# Patient Record
Sex: Male | Born: 1960 | Race: White | Hispanic: No | Marital: Married | Smoking: Current every day smoker
Health system: Southern US, Community
[De-identification: ages and names within clinical notes are randomized; demographics above are authoritative.]

## PROBLEM LIST (undated history)

## (undated) DIAGNOSIS — M1990 Unspecified osteoarthritis, unspecified site: Secondary | ICD-10-CM

---

## 2011-08-09 ENCOUNTER — Emergency Department: Payer: Self-pay | Admitting: Emergency Medicine

## 2012-07-01 ENCOUNTER — Emergency Department: Payer: Self-pay | Admitting: Emergency Medicine
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
## 2014-03-24 ENCOUNTER — Emergency Department: Payer: Self-pay | Admitting: Emergency Medicine

## 2014-07-14 ENCOUNTER — Emergency Department: Payer: Self-pay | Admitting: Internal Medicine

## 2014-10-13 ENCOUNTER — Encounter: Payer: Self-pay | Admitting: Emergency Medicine

## 2014-10-13 ENCOUNTER — Emergency Department
Admission: EM | Admit: 2014-10-13 | Discharge: 2014-10-13 | Disposition: A | Discharge status: Home / Self Care | Payer: Self-pay | Attending: Emergency Medicine | Admitting: Emergency Medicine

## 2014-10-13 DIAGNOSIS — M2550 Pain in unspecified joint: Secondary | ICD-10-CM

## 2014-10-13 DIAGNOSIS — Z720 Tobacco use: Secondary | ICD-10-CM | POA: Insufficient documentation

## 2014-10-13 DIAGNOSIS — M069 Rheumatoid arthritis, unspecified: Secondary | ICD-10-CM | POA: Insufficient documentation

## 2014-10-13 HISTORY — DX: Unspecified osteoarthritis, unspecified site: M19.90

## 2014-10-13 MED ORDER — OXYCODONE-ACETAMINOPHEN 5-325 MG PO TABS: 1.0000 | ORAL_TABLET | Freq: Four times a day (QID) | ORAL | Status: DC | PRN

## 2014-10-13 MED ORDER — PREDNISONE 20 MG PO TABS: 20.0000 mg | ORAL_TABLET | Freq: Every day | ORAL | Status: DC

## 2014-10-13 NOTE — ED Notes (Signed)
Pt with joint pain for a couple of years but worse recently. Pt with arthritis and states has appt at [facility] but cannot wait.

## 2014-10-13 NOTE — Discharge Instructions (Signed)
Please call [facility] at [phone] or use [facility] MyChart to send a message to your doctor if the prednisone does not seem to be helping the pain. Please seek medical attention for any high fevers, chest pain, shortness of breath, change in behavior, persistent vomiting, bloody stool or any other new or concerning symptoms.  Rheumatoid Arthritis Rheumatoid arthritis is a long-term (chronic) inflammatory disease that causes pain, swelling, and stiffness of the joints. It can affect the entire body, including the eyes and lungs. The effects of rheumatoid arthritis vary widely among those with the condition. CAUSES  The cause of rheumatoid arthritis is not known. It tends to run in families and is more common in women. Certain cells of the body's natural defense system (immune system) do not work properly and begin to attack healthy joints. It primarily involves the connective tissue that lines the joints (synovial membrane). This can cause damage to the joint. SYMPTOMS   Pain, stiffness, swelling, and decreased motion of many joints, especially in the hands and feet.  Stiffness that is worse in the morning. It may last 1-2 hours or longer.  Numbness and tingling in the hands.  Fatigue.  Loss of appetite.  Weight loss.  Low-grade fever.  Dry eyes and mouth.  Firm lumps (rheumatoid nodules) that grow beneath the skin in areas such as the elbows and hands. DIAGNOSIS  Diagnosis is based on the symptoms described, an exam, and blood tests. Sometimes, X-rays are helpful. TREATMENT  The goals of treatment are to relieve pain, reduce inflammation, and to slow down or stop joint damage and disability. Methods vary and may include:  Maintaining a balance of rest, exercise, and proper nutrition.  Medicines:  Pain relievers (analgesics).  Corticosteroids and nonsteroidal anti-inflammatory drugs (NSAIDs) to reduce inflammation.  Disease-modifying antirheumatic drugs (DMARDs) to try to  slow the course of the disease.  Biologic response modifiers to reduce inflammation and damage.  Physical therapy and occupational therapy.  Surgery for patients with severe joint damage. Joint replacement or fusing of joints may be needed.  Routine monitoring and ongoing care, such as office visits, blood and urine tests, and X-rays. HOME CARE INSTRUCTIONS   Remain physically active and reduce activity when the disease gets worse.  Eat a well-balanced diet.  Put heat on affected joints when you wake up and before activities. Keep the heat on the affected joint for as long as directed by your health care provider.  Put ice on affected joints following activities or exercising.  Put ice in a plastic bag.  Place a towel between your skin and the bag.  Leave the ice on for 15-20 minutes, 3-4 times per day, or as directed by your health care provider.  Take medicines and supplements only as directed by your health care provider.  Use splints as directed by your health care provider. Splints help maintain joint position and function.  Do not sleep with pillows under your knees. This may lead to spasms.  Participate in a self-management program to keep current with the latest treatment and coping skills. SEEK IMMEDIATE MEDICAL CARE IF:  You have fainting episodes.  You have periods of extreme weakness.  You rapidly develop a hot, painful joint that is more severe than usual joint aches.  You have chills.  You have a fever. FOR MORE INFORMATION   American College [employer] Rheumatology: www.rheumatology.org  Arthritis Foundation: www.arthritis.org Document Released: 04/14/2000 Document Revised: 09/01/2013 Document Reviewed: 05/24/2011 [facility] Patient Information 2015 [place], [place]. This information  is not intended to replace advice given to you by your health care provider. Make sure you discuss any questions you have with your health care provider.

## 2014-10-13 NOTE — ED Provider Notes (Signed)
[facility] Emergency Department Provider Note   ____________________________________________  Time seen: 1405  I have reviewed the triage vital signs and the nursing notes.   HISTORY  Chief Complaint Joint Pain   History limited by: Not Limited   HPI Francisco Woods is a 54 y.o. male who presents to the emergency department with bilateral knee, hip, shoulder and neck pain. Patient states that the symptoms have been severe for the past couple of days. He has had similar pain for a number of years and is being followed at [facility] rheumatology. He states he has been told he has rheumatoid arthritis. Had been on 60 mg of prednisone however his prescription ended about 2 weeks ago. He states that since then the pain has gradually been coming back. He denies any fevers, nausea or vomiting, chest pain or shortness of breath.     Past Medical History  Diagnosis Date  . Arthritis     There are no active problems to display for this patient.   History reviewed. No pertinent past surgical history.  No current outpatient prescriptions on file.  Allergies Review of patient's allergies indicates no known allergies.  No family history on file.  Social History History  Substance Use Topics  . Smoking status: Current Every Day Smoker -- 1.00 packs/day    Types: Cigarettes  . Smokeless tobacco: Not on file  . Alcohol Use: Yes     Comment: occassionally     Review of Systems  Constitutional: Negative for fever. Cardiovascular: Negative for chest pain. Respiratory: Negative for shortness of breath. Gastrointestinal: Negative for abdominal pain, vomiting and diarrhea. Genitourinary: Negative for dysuria. Musculoskeletal: Positive for back pain, knee pain, hip pain, shoulder pain, neck pain Skin: Negative for rash. Neurological: Negative for headaches, focal weakness or numbness.   10-point ROS otherwise  negative.  ____________________________________________   PHYSICAL EXAM:  VITAL SIGNS: ED Triage Vitals  Enc Vitals Group     BP 10/13/14 1208 124/73 mmHg     Pulse Rate 10/13/14 1208 74     Resp 10/13/14 1208 18     Temp 10/13/14 1208 98.5 F (36.9 C)     Temp Source 10/13/14 1208 Oral     SpO2 10/13/14 1208 99 %     Weight 10/13/14 1208 154 lb (69.854 kg)     Height 10/13/14 1208 6' (1.829 m)     Head Cir --      Peak Flow --      Pain Score 10/13/14 1208 10   Constitutional: Alert and oriented. Well appearing and in no distress. Eyes: Conjunctivae are normal. PERRL. Normal extraocular movements. ENT   Head: Normocephalic and atraumatic.   Nose: No congestion/rhinnorhea.   Mouth/Throat: Mucous membranes are moist.   Neck: No stridor. Hematological/Lymphatic/Immunilogical: No cervical lymphadenopathy. Cardiovascular: Normal rate, regular rhythm.  No murmurs, rubs, or gallops. Respiratory: Normal respiratory effort without tachypnea nor retractions. Breath sounds are clear and equal bilaterally. No wheezes/rales/rhonchi. Gastrointestinal: Soft and nontender. No distention.  Genitourinary: Deferred Musculoskeletal: Slightly painful range of motion of knees, shoulders. Neurologic:  Normal speech and language. No gross focal neurologic deficits are appreciated. Speech is normal.  Skin:  Skin is warm, dry and intact. No rash noted. Psychiatric: Mood and affect are normal. Speech and behavior are normal. Patient exhibits appropriate insight and judgment.  ____________________________________________    LABS (pertinent positives/negatives)  None  ____________________________________________   EKG  None  ____________________________________________    RADIOLOGY  None  ____________________________________________  PROCEDURES  Procedure(s) performed: None  Critical Care performed: No  ____________________________________________   INITIAL  IMPRESSION / ASSESSMENT AND PLAN / ED COURSE  Pertinent labs & imaging results that were available during my care of the patient were reviewed by me and considered in my medical decision making (see chart for details).  Patient here with joint pain and history of rheumatoid arthritis. States that he ran out of his prednisone roughly 2 weeks ago and has had increasing pain since then. I talked to [facility] rheumatology who recommended placing the patient on 20 mg of prednisone. I will also give short course of pain medication. Patient has follow-up appointment scheduled on the 27th.  ____________________________________________   FINAL CLINICAL IMPRESSION(S) / ED DIAGNOSES  Final diagnoses:  Joint pain  Rheumatoid arthritis     Phineas Semen, MD 10/13/14 1450

## 2014-10-13 NOTE — ED Notes (Addendum)
Pt reports pain to bilateral knees, right hip, left shoulder and neck.

## 2014-11-23 ENCOUNTER — Ambulatory Visit: Payer: Self-pay | Admitting: Family Medicine

## 2015-02-17 ENCOUNTER — Emergency Department
Admission: EM | Admit: 2015-02-17 | Discharge: 2015-02-17 | Disposition: A | Discharge status: Home / Self Care | Payer: Self-pay | Attending: Emergency Medicine | Admitting: Emergency Medicine

## 2015-02-17 DIAGNOSIS — M069 Rheumatoid arthritis, unspecified: Secondary | ICD-10-CM | POA: Insufficient documentation

## 2015-02-17 DIAGNOSIS — G8929 Other chronic pain: Secondary | ICD-10-CM | POA: Insufficient documentation

## 2015-02-17 DIAGNOSIS — Z720 Tobacco use: Secondary | ICD-10-CM | POA: Insufficient documentation

## 2015-02-17 DIAGNOSIS — M549 Dorsalgia, unspecified: Secondary | ICD-10-CM | POA: Insufficient documentation

## 2015-02-17 MED ORDER — TRAMADOL HCL 50 MG PO TABS: 50.0000 mg | ORAL_TABLET | Freq: Four times a day (QID) | ORAL | Status: DC | PRN

## 2015-02-17 MED ORDER — PREDNISONE 20 MG PO TABS: 20.0000 mg | ORAL_TABLET | Freq: Every day | ORAL | Status: AC

## 2015-02-17 NOTE — ED Notes (Signed)
Here for med refill, cant' Rx meds from [facility]. Hx RA

## 2015-02-17 NOTE — ED Provider Notes (Signed)
[facility] Emergency Department Provider Note  ____________________________________________  Time seen: Approximately 10:57 AM  I have reviewed the triage vital signs and the nursing notes.   HISTORY  Chief Complaint Joint Pain  HPI Francisco Woods is a 54 y.o. male is here with complaint of bilateral knee pain, right ankle pain and left shoulder pain due to his rheumatoid arthritis. He states that he sees someone at [facility] and has not seen them for several months. He does not take any rheumatoid medications as "he cannot afford them" and he is waiting for "charity care" at [facility]. He states he has never taken any medication for his rheumatoid arthritis. Currently he complains that his pain being 10 out of 10 and increased pain with walking. Pain is constant and nonradiating. He denies any previous peptic ulcer disease or difficulty taking medication.   Past Medical History  Diagnosis Date  . Arthritis     There are no active problems to display for this patient.   History reviewed. No pertinent past surgical history.  Current Outpatient Rx  Name  Route  Sig  Dispense  Refill  . oxyCODONE-acetaminophen (ROXICET) 5-325 MG per tablet   Oral   Take 1 tablet by mouth every 6 (six) hours as needed for severe pain.   10 tablet   0   . predniSONE (DELTASONE) 20 MG tablet   Oral   Take 1 tablet (20 mg total) by mouth daily.   30 tablet   0   . traMADol (ULTRAM) 50 MG tablet   Oral   Take 1 tablet (50 mg total) by mouth every 6 (six) hours as needed.   20 tablet   0     Allergies Review of patient's allergies indicates no known allergies.  History reviewed. No pertinent family history.  Social History Social History  Substance Use Topics  . Smoking status: Current Every Day Smoker -- 1.00 packs/day    Types: Cigarettes  . Smokeless tobacco: None  . Alcohol Use: Yes     Comment: occassionally     Review of Systems Constitutional: No  fever/chills Eyes: No visual changes. ENT: No sore throat. Cardiovascular: Denies chest pain. Respiratory: Denies shortness of breath. Gastrointestinal: No abdominal pain.  No nausea, no vomiting.  Genitourinary: Negative for dysuria. Musculoskeletal: Positive for chronic back pain Skin: Negative for rash. Neurological: Negative for headaches, focal weakness or numbness.  10-point ROS otherwise negative.  ____________________________________________   PHYSICAL EXAM:  VITAL SIGNS: ED Triage Vitals  Enc Vitals Group     BP 02/17/15 1021 125/103 mmHg     Pulse Rate 02/17/15 1021 82     Resp 02/17/15 1021 18     Temp 02/17/15 1021 97.9 F (36.6 C)     Temp Source 02/17/15 1021 Oral     SpO2 02/17/15 1021 100 %     Weight 02/17/15 1021 145 lb (65.772 kg)     Height 02/17/15 1021 6' (1.829 m)     Head Cir --      Peak Flow --      Pain Score 02/17/15 1022 10     Pain Loc --      Pain Edu? --      Excl. in GC? --     Constitutional: Alert and oriented. Well appearing and in no acute distress. Eyes: Conjunctivae are normal. PERRL. EOMI. Head: Atraumatic. Nose: No congestion/rhinnorhea.  Neck: No stridor.  Supple Hematological/Lymphatic/Immunilogical: No cervical lymphadenopathy. Cardiovascular: Normal rate, regular rhythm.  Grossly normal heart sounds.  Good peripheral circulation. Respiratory: Normal respiratory effort.  No retractions. Lungs CTAB. Gastrointestinal: Soft and nontender. No distention. Musculoskeletal: Moderate tenderness on palpation of the right knee with degenerative changes. Range of motion is restricted secondary to pain. No effusion is noted in the right knee. There is moderate crepitus. Right ankle with arthritic changes and no effusion or soft tissue edema noted. Left knee tender with less degenerative change than the right. No effusion noted. Right shoulder range of motion slightly restricted. There is generalized tenderness on palpation of the right  shoulder. Neurologic:  Normal speech and language. No gross focal neurologic deficits are appreciated. No gait instability. Skin:  Skin is warm, dry and intact. No rash noted. There is no warmth, erythema, or ecchymosis noted in the above-mentioned joints. Psychiatric: Mood and affect are normal. Speech and behavior are normal.  ____________________________________________   LABS (all labs ordered are listed, but only abnormal results are displayed)  Labs Reviewed - No data to display   RADIOLOGY  Patient refused ____________________________________________   PROCEDURES  Procedure(s) performed: None  Critical Care performed: No  ____________________________________________   INITIAL IMPRESSION / ASSESSMENT AND PLAN / ED COURSE  Pertinent labs & imaging results that were available during my care of the patient were reviewed by me and considered in my medical decision making (see chart for details).  Patient was encouraged to follow up with his rheumatologist and be compliant with his medication. He is also to check on his "charity care" for medication. Today he was given a prescription for tramadol one every 6 hours as needed for pain. He is also given a prescription for prednisone 20 mg daily for 30 days. He is also given information about rheumatoid arthritis. Patient refused knee x-ray in the emergency room today. ____________________________________________   FINAL CLINICAL IMPRESSION(S) / ED DIAGNOSES  Final diagnoses:  Rheumatoid arthritis involving multiple joints (HCC)      Tommi Rumps, PA-C 02/17/15 1322  Governor Rooks, MD 02/17/15 [phone]

## 2015-02-17 NOTE — ED Notes (Signed)
Patient comes in with complaints of bilateral knee pain, right ankle and left shoulder pain secondary to rheumatoid arthritis.

## 2015-02-17 NOTE — Discharge Instructions (Signed)
He was given an appointment with your doctor at [facility] for your rheumatoid arthritis. Take prednisone as prescribed daily. Tramadol as needed for pain. Rheumatoid arthritis actually destroys the bone and will continue to do so. It is important that you follow-up with your rheumatologist on a regular basis.

## 2015-06-27 NOTE — ED Provider Notes (Signed)
 [facility] Emergency Department Provider Note   ED Course, Assessment and Plan   Initial Clinical Impression:  June 27, 2015 6:48 PM  Francisco Woods is a 55 y.o. y/o male with possible rheumatoid flare. Will treat with pain medications, nausea medications, obtain inflammatory markers, basic labs, x-ray of his shoulder.  If these are negative, anticipate discharge. Handing off to Dr. Oletha Sierras.   The case was discussed with attending physician who is in agreement with the above assessment and plan    History   CHIEF COMPLAINT:  Chief Complaint  Patient presents with  . Shoulder Pain  . Emesis    HPI: Francisco Woods is a 55 y.o. male who presents with onset of throbbing right neck right shoulder right elbow and right wrist pain over the past 4-5 hours. 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 During the course of his tenderness activity began to develop this pain down the right side of his arm and his neck the pain is also present in between the joints. There is worsened with motion or palpation of the arm. It is worse in the joints. He states his a history of rheumatoid arthritis is only in the past for pain medication no biologics. He is refusing to answer complicated questions or cooperate with exam maneuvers, stating he needs some pain medications first. He states he vomited on the way here.  Patient later states that he takes Suboxone twice a day, due to a history of pill abuse. He states he uses no other pain medications apart from ibuprofen.   PAST MEDICAL HISTORY/PAST SURGICAL HISTORY:  Past Medical History  Diagnosis Date  . Back pain   . Rheumatoid arthritis involving multiple sites with positive rheumatoid factor (RAF-HCC) 09/11/2014  . Rheumatoid arthritis (RAF-HCC)     MEDICATIONS:  Current Outpatient Rx  Name  Route  Sig  Dispense  Refill  . colchicine 0.6 mg tablet    Oral   Take 1 tablet (0.6 mg total) by mouth Two (2) times a day.   60 tablet   4   . folic acid (FOLVITE) 1 MG tablet   Oral   Take 1 tablet (1 mg total) by mouth daily.   100 tablet   3   . meloxicam (MOBIC) 15 MG tablet   Oral   Take 1 tablet (15 mg total) by mouth daily.   30 tablet   5   . methotrexate 2.5 MG tablet      Take 4 tablets weekly for 2 weeks, then 6 tablets weekly for 2 weeks, then 8 tablets weekly thereafter.   40 tablet   3     Patient will titrate dose up as directed.  Future  ...   . oxyCODONE -acetaminophen  (PERCOCET) 5-325 mg per tablet   Oral   Take 1 tablet by mouth every four (4) hours as needed for pain.   20 tablet   0     ALLERGIES:  Review of patient's allergies indicates no known allergies.  SOCIAL HISTORY:  Social History  Substance Use Topics  . Smoking status: Current Every Day Smoker -- 1.00 packs/day    Types: Cigarettes  . Smokeless tobacco: Never Used  . Alcohol Use: No     Comment: cannot drink on new meds    FAMILY HISTORY: Family History  Problem Relation Age of Onset  . Autoimmune disease Neg Hx  Review of Systems  Constitutional: Negative for weight loss Respiratory: Negative for hemoptysis A 10 point review of systems was performed and is negative other than positive elements noted in HPI    Physical Exam   VITAL SIGNS:   BP 121/76 mmHg  Pulse 68  Temp(Src) 36.7 C (98.1 F) (Oral)  Resp 16  SpO2 97%  General: no apparent distress  HENT: no stridor, MMM, atraumatic Eyes: conjunctiva normal, no discharge Cardiovascular: S1,S2 heard, 2+ symmetric distal pulses Respiratory: no respiratory distress, normal chest excursion, no wheezing  Abdomen: soft, non-distended  Skin: no diaphoresis, normal color Extremities: Neurovascularly intact in his hand. He moves his elbow well, is hesitant and refusing to allow me to try to move his shoulder. His joints are not warm though they're diffusely tender to  palpation. His arms are tender to palpation in between his joints as well. Neurologic: CN II-XII grossly intact, no slurred speech or dysarthria, normal sensation and motor function, moves all 4 extremities equally Psychiatric: Mood appropriate   Radiology   XR Shoulder 3 Or More Views Right  Final Result     Xr Shoulder 3 Or More Views Right  06/27/2015  EXAM: SHOULDER COMPLETE RIGHT DATE: 06/26/15 20:46:24 ACCESSION: [id number] UN DICTATED: 06/26/15 21:21:01 INTERPRETATION LOCATION: [facility] CLINICAL INDICATION: 55 Year Old (M): Right shoulder pain COMPARISON: None. TECHNIQUE: AP, Grashey, and axillary views of the right shoulder. FINDINGS: No acute fracture or dislocation. Calcium density about the superior proximal humerus is likely from chronic rotator cuff disease. Joint spaces are otherwise preserved and in normal alignment. Partially imaged right lung clear. No radiopaque foreign body. IMPRESSION: Chronic rotator cuff disease without acute radiographic injury of the right shoulder.     Labs    Pertinent labs & imaging results that were available during my care of the patient were reviewed by me and considered in my medical decision making (see chart for details).   Please note- This chart has been created using [employer]. Chart creation errors have been sought, but may not always be located and such creation errors, especially pronoun confusion, do NOT reflect on the standard of medical care.   Fairy JINNY Glaze, MD Resident 06/27/15 [phone]

## 2015-10-07 IMAGING — CR LEFT THUMB 2+V
1 series · 3 of 3 positions shown · non-contrast
Comparison: None.

CLINICAL DATA: Left thumb injury with solid

EXAM:
LEFT THUMB 2+V

[Series 1: pa · 0.17mm/px · 3 of 3 slices shown]
[im 1/3]
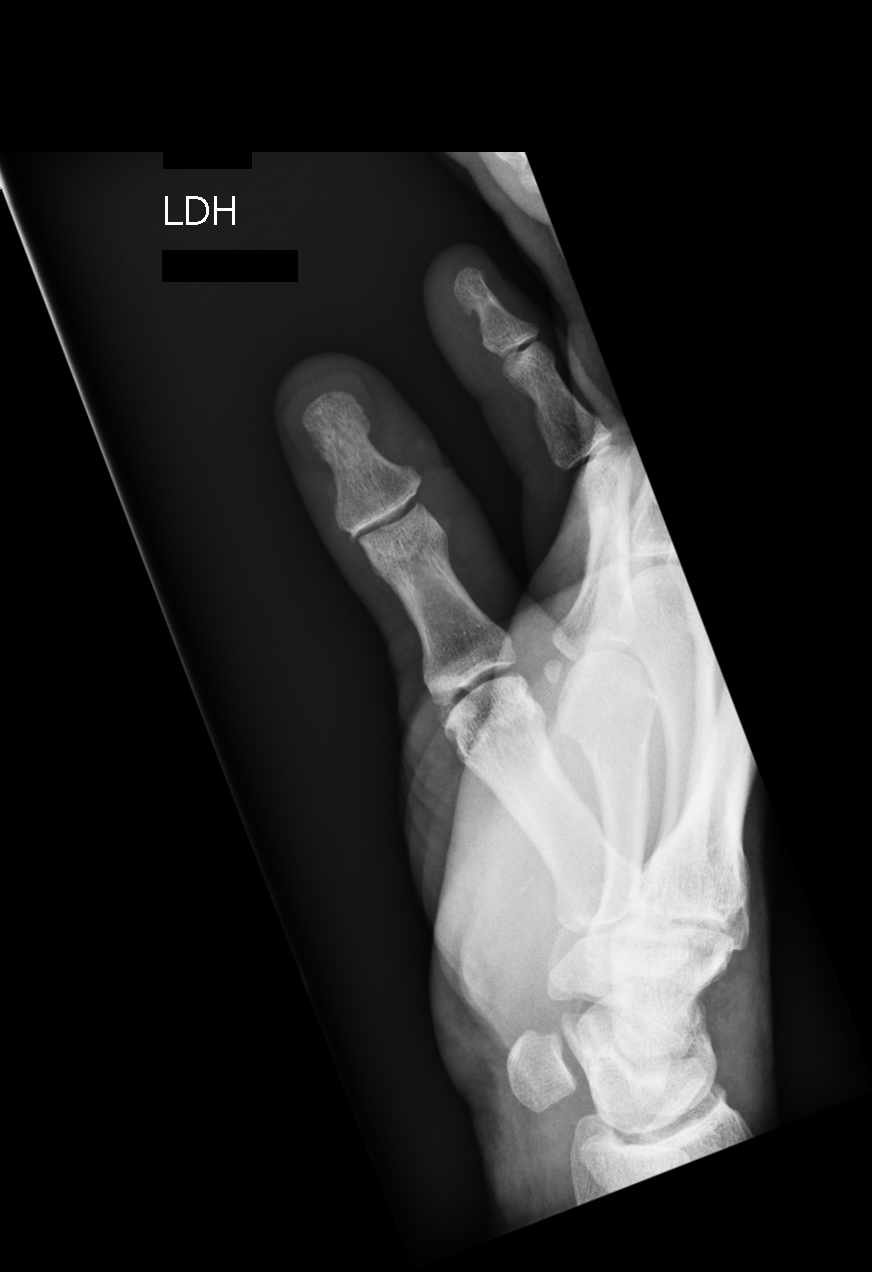
[im 2/3]
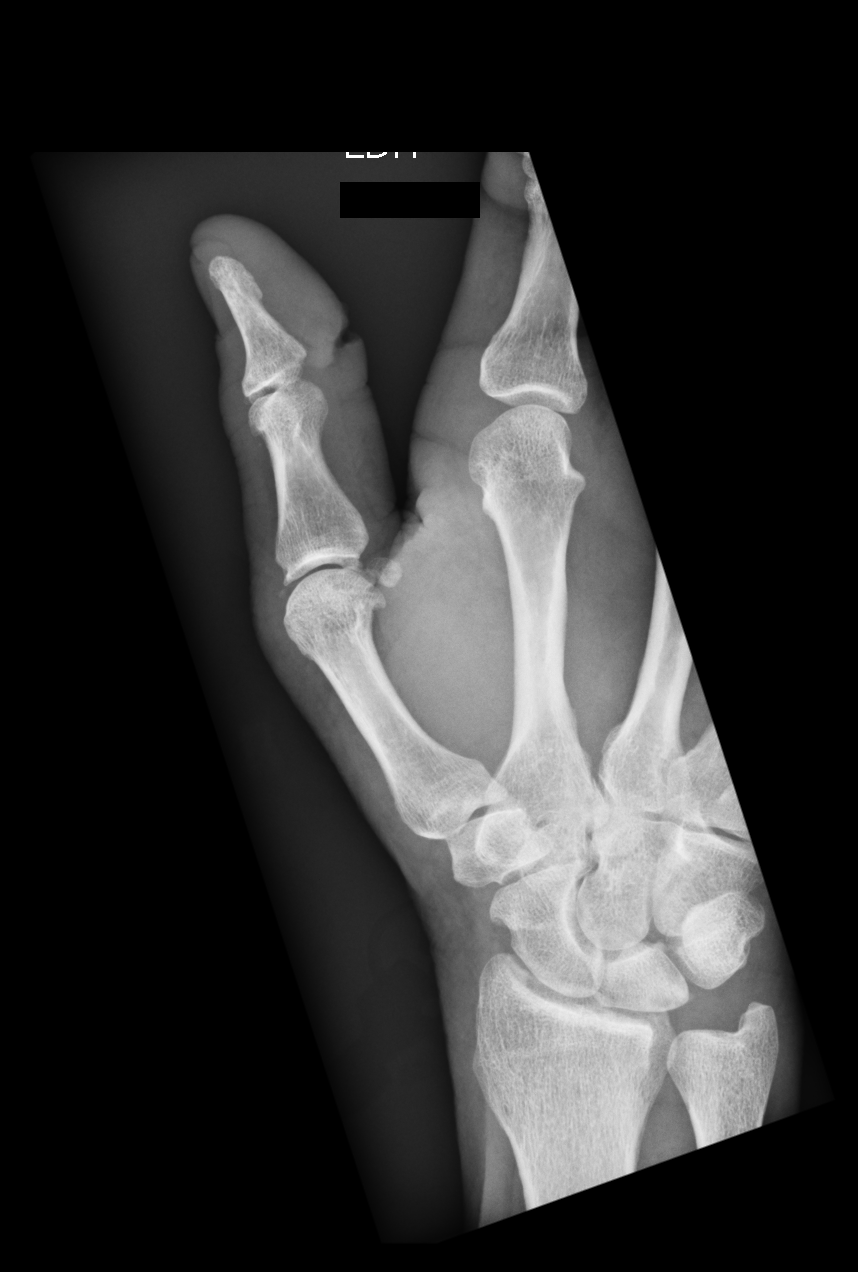
[im 3/3]
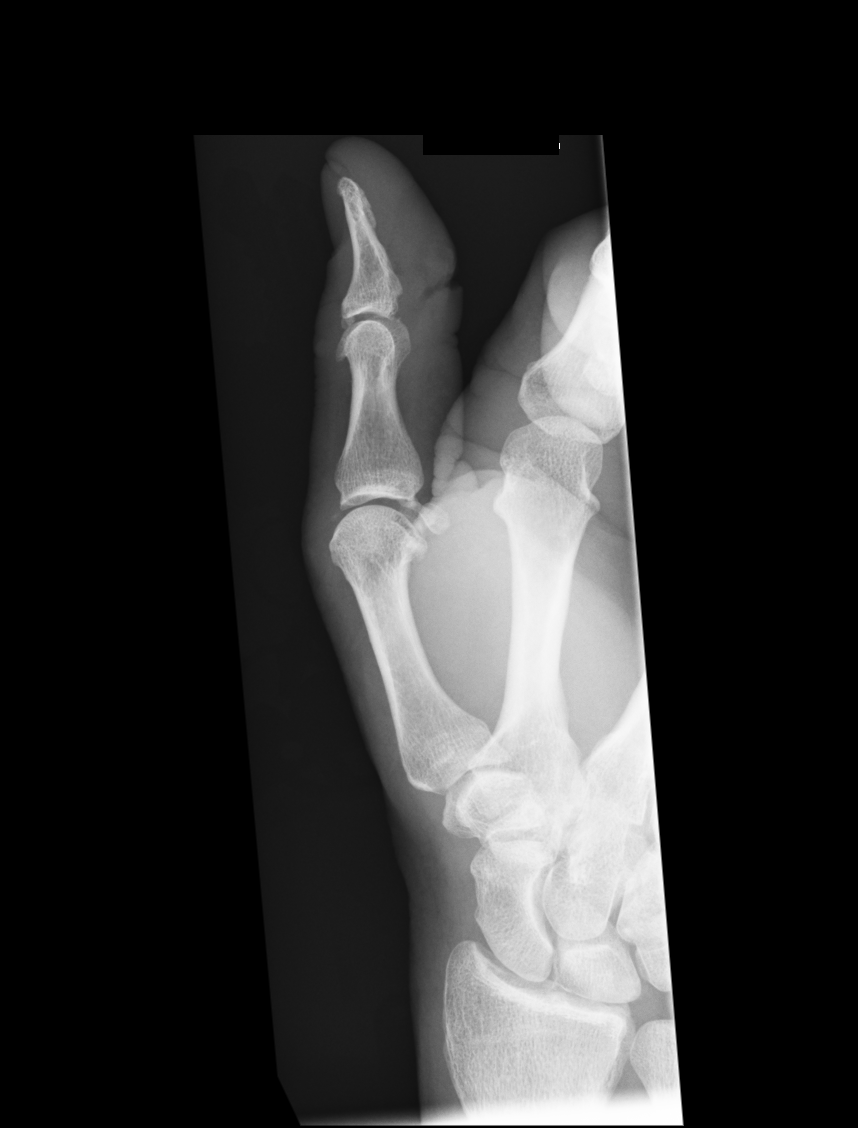

[3 of 3 positions shown; findings below may reference images not displayed]

FINDINGS: There is a soft tissue injury at the tuft of the thumb. No
underlying fracture or dislocation.
IMPRESSION: No acute bony injury.

## 2016-01-10 NOTE — Progress Notes (Signed)
 [facility]  Adult Sick Visit   Assessment & Plan:  Francisco Woods is a 55 y.o. male who presented in follow up of his RA, arthritis pain, abdominal pain, and new urinary complaints.  Early satiety, unintentional weight loss, abdominal pain: Patient has increased risk of GI malignancy considering his smoking and EtOH history. Discussed this with him today. I think that his ongoing weight loss is concerning enough to warrant EGD and colonoscopy. He has not had a colonoscopy for screening after age 76, so he is due for this anyway. Previously with an anemia, but this resolved as of last visit. Considering the amount of ibuprofen and BC powder he is using, would also consider PUD as a cause for his abdominal pain. He has new complaints of urinary stream hesitancy/interruption, so prostate etiology is a consideration as well. Will check a PSA. CBC was normal 1 mo ago. - EGD - colonoscopy - stop smoking- patient is contemplative. States that his mother has patches at home that she is not going to use. Recommended that he start cutting back as smoking is likely exacerbating his GERD and possibly contributing to his weight loss. - cut back to only 2 EtOH drinks per day - restart omeprazole. Will add ranitidine in addition as previously with no benefit on solely omeprazole.  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 Patient is supposed to be on MTX therapy, but self discontinued this medication after ~3 months secondary to no effect. Discussed with him that it is inappropriate to discontinue these medications without talking with rheumatology. If he wants to decrease the amount of inflammation in his joints, he needs to keep his appointments and attempt to take the medications as directed. He will  likely not be eligible for alternative regimens until he is documented to have failed MTX officially, which requires him to take the medication and follow up with his rheumatologist. He states that he understands this and will make efforts to make his next scheduled appt with them. As far as his pain control goes, he can actually take more ibuprofen than he is taking. Asked him to increase to 600-800mg  ibuprofen QID. He needs to stop taking BC powders, which are more likely to cause abdominal pain. As above, will treat abdominal pain with H2 blocker therapy in an attempt to make taking ibuprofen less difficult for his stomach. Will also start lyrica 50 mg BID today. We can increase this over time if he finds effect in it. Discussed with him that this medication will take time to kick in and is not as immediate as ibuprofen/BC powders. - start lyrica 50 mg BID, will follow up in 1 mo and increase if needed - increase ibuprofen to 600-800mg  QID prn, H2 blocker therapy as above - return to rheumatology - ESR check today. CRP elevated at last visit.  Urinary Hesitancy, Interrupted stream: denies hematuria. Cr slightly increased from prior one month ago, but wnl. Declined prostate exam today. Given his unintentional weight loss, this is somewhat concerning. Would consider prostate cancer. With cigarette use and occupational chemical use, he is likely also at increased risk of renal and bladder cancer.  - Will check PSA, as above.  - Repeat BMP today to ensure that he is not having sustained increase in Cr.  - Will check UA as well.  Follow-up:  No future appointments.  This patient was discussed with attending physician, Dr. Gwenn, who agrees with the assessment and plan above.   Subjective:  Francisco Woods is a 55 y.o. male who presents in follow up  States that he is having a lot of pain. He is having pain in almost all of his joints. Hands, hips, back are bothering him the most. Has about  35-40 minutes of morning stiffness. He is having Raynaud's in relation to the weather changes which has been bothersome. Currently not taking any medication for his pain other than BC powder and ibuprofen. Taking about 6-8 ibuprofen a day. BC powders he is taking 2-3 times per day. Has tried hot and cold packs without significant improvement when he stops them. Was previously on MTX without significant relief in the past, so he stopped taking it.  He states that he is also having stomach pain. States that he has symptoms of acid reflux, early satiety, incomplete swallowing. He has unintentional weight loss of 5 lbs in the past 4 months. Denies blood/melena in BM. No change in stool frequency. He is a 1 ppd smoker and lives with mom who also smokes. Drinks 4-5 beers at a time 3-4 times per week. He stopped taking prilosec. Never throwing up blood. CBC at last check was normal (improved from 5 months previously).  Sleeping medication was not helping him. He took it until the prescription ran out and then stopped. He is waking up from the pain, which makes things more difficult.  Also with slow urination, intermittent stream. Waking up several times a night to urinate. Cr at last check was mildly elevated from 6 months previously.  ROS: pertinent items are noted in HPI.  Past Medical History: Past Medical History:  Diagnosis Date  . Back pain   . Rheumatoid arthritis (RAF-HCC)   . Rheumatoid arthritis involving multiple sites with positive rheumatoid factor (RAF-HCC) 09/11/2014    Medications: Current Outpatient Prescriptions  Medication Sig Dispense Refill  . ibuprofen (ADVIL,MOTRIN) 200 MG tablet Take 200 mg by mouth once as needed for pain.    SABRA omeprazole (PRILOSEC) 40 MG capsule Take 1 capsule (40 mg total) by mouth daily. 30 capsule 1  . traZODone (DESYREL) 50 MG tablet Take 1 tablet (50 mg total) by mouth nightly. 30 tablet 3   No current facility-administered medications for this visit.      Allergies: No Known Allergies   Objective:   Vitals:   01/10/16 1516  BP: 143/59  BP Site: L Arm  Pulse: 77  Weight: 65.3 kg  Height: 185.4 cm (6' 0.99)   Wt Readings from Last 3 Encounters:  01/10/16 65.3 kg  11/22/15 65.7 kg  09/13/15 67.9 kg   Body mass index is 19 kg/m.   Physical Exam:  Gen: NAD, well-appearing, thin, pleasant, smells heavily of cigarettes HEENT: PERRL, EOMI, No Icterus. Oropharynx without exudate or erythema. Poor dentition. Mucous membranes moist.  No adenopathy. CVS: RRR, normal S1 and S2, No MRG. PULM: CTAB, no increased WOB. ABD: +BS, Soft, tender throughout, negative rebound EXTR: Cap refill <2 sec. Warm, dry. Enlarged MCP and PIP joints of hands bilaterally, particularly the thumb and forefinger MCPs. No erythema or warmth over joints. Mild synovitis of the thumb and forefinger. ROM throughout. SKIN: no rash noted. No Raynaud's present currently

## 2022-06-15 NOTE — ED Triage Notes (Signed)
 Patient reports pain, swelling in left testicle that he noticed four days ago. Patient endorses difficulty starting a stream, emptying bladder but denies frequency, urgency and burning .

## 2022-06-15 NOTE — ED Provider Notes (Signed)
 Emergency Department Provider Note   Medical Decision Making    Jabir Dahlem Carrera is a pleasant 62 y.o. male w/ a history of tobacco use, rheumatoid arthritis, back pain who presents with 4 days of left testicular pain and swelling associated with dysuria and hesitant urinary stream.  On presentation, vitals were hypertensive to the 200/100s but otherwise WNL on room air. Physical exam revealed chronically ill-appearing adult male who appears older than stated age but otherwise nontoxic with a GU exam that showed a diffusely swollen and tender left testicle with no penile or scrotal lesions, evidence of inguinal hernia.  Unable to assess for cremaster reflex given testicles fully retracted to base of penis.   Initial differential included epididymitis, orchitis, testicular abscess, torsion.  Inguinal hernia with extension into the scrotum seems less likely given his exam but is also a possibility.  Urinalysis moderate leukocyte esterase and positive nitrites with 10 WBCs.  Testicular/scrotal ultrasound showed an enlarged left epididymitis with fluid collection consistent with hydrocele.  Gonorrhea/chlamydia sent.  Ultimate impression was most likely acute infectious epididymitis of the left testes.  Given that he is sexually active with multiple partners I began management with ceftriaxone and levofloxacin.  I sent referral for urology follow-up to ensure that this is not secondary to an epididymal lesion.  Return precautions reviewed.  Historians interviewed Patient  Studies independently reviewed  None  Consultants involved  None  Prior records reviewed  None  Testing considered None  Social factors affecting disposition  Ongoing tobacco and alcohol use  Disposition  Safe for discharge home   History   HPI: Waco Foerster is a pleasant 62 y.o. male w/ a history of tobacco use, rheumatoid arthritis, back pain who presents with left testicular pain and swelling.  He reports that he  was in his usual state of health until about 4 days ago when he began to get dysuria and pain and swelling of his left testicle.  He says that the pain radiates to his left groin.  He notes that it is somewhat difficult to initiate a stream during this time.  He denies fever, chills, cough, shortness of breath, nausea, vomiting, diarrhea, hematuria, urinary frequency, penile lesions, scrotal lesions, erythema in the groin and genitals, and itching of the groin and genitals.  He says that he is sexually active with women only.  Says he has had 2 partners in the last year.  He denies ever having an STI in the past.  Says that he has never had urologic surgery before.  Physical Exam   Vitals:  Vitals:   06/15/22 0911  BP: (S) 206/106  Pulse: 90  Resp: 20  Temp: 36.4 C (97.6 F)  TempSrc: Temporal  SpO2: 98%     Physical Exam:  General: Chronically ill-appearing adult male who appears older than stated age.  No apparent distress.  Nontoxic. Head: Atraumatic and normocephalic.   Eyes: Conjugate gaze. No scleral icterus or conjunctivitis.   ENT: Moist mucous membranes. No stridor. Controlling secretions well.   Cardiac: RRR.  No MRG. Pulmonary: Breathing comfortably w/ no accessory muscle usage. Normal inspiratory:expiratory ratio. Clear to auscultation bilaterally.   Abdominal: Soft, non-distended, non-tender abdomen.   GU: Performed with male chaperone present.  The penis appears normal.  There are no inguinal masses.  There is a swollen and diffusely tender left testicle.  There are no lesions on the penis or scrotum.  Unable to assess for cremasteric reflex given that both testicles are already fully  retracted to the base of the penis. MSK: No lower extremity edema.   Dermatologic: No jaundice, cyanosis, pallor, or rashes visible on exposed skin.   Neurologic: Alert. Follows commands. Moving all extremities spontaneously.   Psychiatric: Normal mood and behavior.      [place]ia [place], MD   Clinical Assistant Professor [facility] Emergency Department           [place] Camellia Mussel, MD 06/15/22 [phone]

## 2024-02-15 ENCOUNTER — Emergency Department

## 2024-02-15 ENCOUNTER — Inpatient Hospital Stay
Admission: EM | Admit: 2024-02-15 | Discharge: 2024-02-24 | DRG: 682 | Disposition: A | Discharge status: Home / Self Care | Attending: Student | Admitting: Student

## 2024-02-15 ENCOUNTER — Other Ambulatory Visit: Payer: Self-pay

## 2024-02-15 DIAGNOSIS — N401 Enlarged prostate with lower urinary tract symptoms: Secondary | ICD-10-CM | POA: Diagnosis present

## 2024-02-15 DIAGNOSIS — Z8249 Family history of ischemic heart disease and other diseases of the circulatory system: Secondary | ICD-10-CM

## 2024-02-15 DIAGNOSIS — I160 Hypertensive urgency: Secondary | ICD-10-CM | POA: Diagnosis present

## 2024-02-15 DIAGNOSIS — E871 Hypo-osmolality and hyponatremia: Secondary | ICD-10-CM | POA: Diagnosis present

## 2024-02-15 DIAGNOSIS — R339 Retention of urine, unspecified: Principal | ICD-10-CM

## 2024-02-15 DIAGNOSIS — E43 Unspecified severe protein-calorie malnutrition: Secondary | ICD-10-CM | POA: Diagnosis present

## 2024-02-15 DIAGNOSIS — N179 Acute kidney failure, unspecified: Principal | ICD-10-CM | POA: Diagnosis present

## 2024-02-15 DIAGNOSIS — F17210 Nicotine dependence, cigarettes, uncomplicated: Secondary | ICD-10-CM | POA: Diagnosis present

## 2024-02-15 DIAGNOSIS — Z681 Body mass index (BMI) 19 or less, adult: Secondary | ICD-10-CM

## 2024-02-15 DIAGNOSIS — I10 Essential (primary) hypertension: Secondary | ICD-10-CM | POA: Diagnosis present

## 2024-02-15 DIAGNOSIS — E872 Acidosis, unspecified: Secondary | ICD-10-CM | POA: Diagnosis present

## 2024-02-15 DIAGNOSIS — M069 Rheumatoid arthritis, unspecified: Secondary | ICD-10-CM | POA: Diagnosis present

## 2024-02-15 DIAGNOSIS — K5900 Constipation, unspecified: Secondary | ICD-10-CM | POA: Diagnosis present

## 2024-02-15 DIAGNOSIS — Z5900 Homelessness unspecified: Secondary | ICD-10-CM

## 2024-02-15 DIAGNOSIS — E875 Hyperkalemia: Secondary | ICD-10-CM | POA: Diagnosis present

## 2024-02-15 DIAGNOSIS — M10071 Idiopathic gout, right ankle and foot: Secondary | ICD-10-CM | POA: Diagnosis present

## 2024-02-15 DIAGNOSIS — D75839 Thrombocytosis, unspecified: Secondary | ICD-10-CM | POA: Diagnosis present

## 2024-02-15 DIAGNOSIS — R338 Other retention of urine: Secondary | ICD-10-CM | POA: Diagnosis present

## 2024-02-15 DIAGNOSIS — E878 Other disorders of electrolyte and fluid balance, not elsewhere classified: Secondary | ICD-10-CM | POA: Diagnosis present

## 2024-02-15 LAB — URINALYSIS, ROUTINE W REFLEX MICROSCOPIC
Bacteria, UA: NONE SEEN
Bilirubin Urine: NEGATIVE
Glucose, UA: NEGATIVE mg/dL
Ketones, ur: NEGATIVE mg/dL
Leukocytes,Ua: NEGATIVE
Nitrite: NEGATIVE
Protein, ur: NEGATIVE mg/dL
Specific Gravity, Urine: 1.011 (ref 1.005–1.030)
Squamous Epithelial / HPF: 0 /HPF (ref 0–5)
pH: 5 (ref 5.0–8.0)

## 2024-02-15 LAB — CBC
HCT: 48 % (ref 39.0–52.0)
Hemoglobin: 16.5 g/dL (ref 13.0–17.0)
MCH: 29.7 pg (ref 26.0–34.0)
MCHC: 34.4 g/dL (ref 30.0–36.0)
MCV: 86.3 fL (ref 80.0–100.0)
Platelets: 461 K/uL — ABNORMAL HIGH (ref 150–400)
RBC: 5.56 MIL/uL (ref 4.22–5.81)
RDW: 13.3 % (ref 11.5–15.5)
WBC: 10.5 K/uL (ref 4.0–10.5)
nRBC: 0 % (ref 0.0–0.2)

## 2024-02-15 LAB — COMPREHENSIVE METABOLIC PANEL WITH GFR
ALT: 15 U/L (ref 0–44)
AST: 19 U/L (ref 15–41)
Albumin: 4 g/dL (ref 3.5–5.0)
Alkaline Phosphatase: 92 U/L (ref 38–126)
Anion gap: 30 — ABNORMAL HIGH (ref 5–15)
BUN: 102 mg/dL — ABNORMAL HIGH (ref 8–23)
CO2: 15 mmol/L — ABNORMAL LOW (ref 22–32)
Calcium: 8.4 mg/dL — ABNORMAL LOW (ref 8.9–10.3)
Chloride: 89 mmol/L — ABNORMAL LOW (ref 98–111)
Creatinine, Ser: 9.62 mg/dL — ABNORMAL HIGH (ref 0.61–1.24)
GFR, Estimated: 6 mL/min — ABNORMAL LOW (ref >60)
Glucose, Bld: 105 mg/dL — ABNORMAL HIGH (ref 70–99)
Potassium: 5.4 mmol/L — ABNORMAL HIGH (ref 3.5–5.1)
Sodium: 134 mmol/L — ABNORMAL LOW (ref 135–145)
Total Bilirubin: 0.8 mg/dL (ref 0.0–1.2)
Total Protein: 9 g/dL — ABNORMAL HIGH (ref 6.5–8.1)

## 2024-02-15 LAB — LIPASE, BLOOD: Lipase: 48 U/L (ref 11–51)

## 2024-02-15 LAB — CBG MONITORING, ED
Glucose-Capillary: 102 mg/dL — ABNORMAL HIGH (ref 70–99)
Glucose-Capillary: 60 mg/dL — ABNORMAL LOW (ref 70–99)

## 2024-02-15 LAB — LACTIC ACID, PLASMA
Lactic Acid, Venous: 2.4 mmol/L (ref 0.5–1.9)
Lactic Acid, Venous: 2.6 mmol/L (ref 0.5–1.9)

## 2024-02-15 MED ORDER — MAGNESIUM HYDROXIDE 400 MG/5ML PO SUSP
30.0000 mL | Freq: Every day | ORAL | Status: DC | PRN
  Administered 2024-02-19: 30 mL via ORAL
  Filled 2024-02-15: qty 30

## 2024-02-15 MED ORDER — ACETAMINOPHEN 650 MG RE SUPP: 650.0000 mg | Freq: Four times a day (QID) | RECTAL | Status: DC | PRN

## 2024-02-15 MED ORDER — TRAZODONE HCL 50 MG PO TABS
25.0000 mg | ORAL_TABLET | Freq: Every evening | ORAL | Status: DC | PRN
  Administered 2024-02-17: 25 mg via ORAL
  Filled 2024-02-15: qty 1

## 2024-02-15 MED ORDER — LACTATED RINGERS IV BOLUS
1000.0000 mL | Freq: Once | INTRAVENOUS | Status: AC
  Administered 2024-02-15: 1000 mL via INTRAVENOUS

## 2024-02-15 MED ORDER — HEPARIN SODIUM (PORCINE) 5000 UNIT/ML IJ SOLN
5000.0000 [IU] | Freq: Three times a day (TID) | INTRAMUSCULAR | Status: DC
Start: 2024-02-16 — End: 2024-02-18
  Filled 2024-02-15 (×4): qty 1

## 2024-02-15 MED ORDER — ACETAMINOPHEN 325 MG PO TABS
650.0000 mg | ORAL_TABLET | Freq: Four times a day (QID) | ORAL | Status: DC | PRN
  Administered 2024-02-16 – 2024-02-20 (×2): 650 mg via ORAL
  Filled 2024-02-15 (×2): qty 2

## 2024-02-15 MED ORDER — ONDANSETRON HCL 4 MG/2ML IJ SOLN
4.0000 mg | Freq: Four times a day (QID) | INTRAMUSCULAR | Status: DC | PRN
  Administered 2024-02-21: 4 mg via INTRAVENOUS
  Filled 2024-02-15: qty 2

## 2024-02-15 MED ORDER — SODIUM CHLORIDE 0.9 % IV SOLN: INTRAVENOUS | Status: AC

## 2024-02-15 MED ORDER — ONDANSETRON HCL 4 MG PO TABS: 4.0000 mg | ORAL_TABLET | Freq: Four times a day (QID) | ORAL | Status: DC | PRN

## 2024-02-15 NOTE — H&P (Signed)
 [place]   PATIENT NAME: Francisco Woods    MR#:  [id number]  DATE OF BIRTH:  1961/03/02  DATE OF ADMISSION:  02/15/2024  PRIMARY CARE PHYSICIAN: Pcp, No   Patient is coming from: Home  REQUESTING/REFERRING PHYSICIAN: Siadecki, Sebastien, MD  CHIEF COMPLAINT:  No chief complaint on file.   HISTORY OF PRESENT ILLNESS:  Francisco Woods is a 64 y.o. Caucasian male with medical history significant for osteoarthritis, BPH and tobacco abuse, who presented to the emergency room with acute onset of lower abdominal pain with significant diminished urine output over the last 4 days.  He admitted to nausea without vomiting or diarrhea.  He has been having tactile fever and chills.  He denied any chest pain or palpitations.  No cough or wheezing or dyspnea.  He admits to mild headache and dizziness.  The last time he saw physician was 3 years ago.  He denies any history of hypertension, diabetes mellitus or chronic kidney disease.  After insertion of a Foley catheter in the ED, the patient had more than 1 L of urine output and felt much better.  ED Course: When the patient came to the ER, BP was 169/118 and later 160/94 with otherwise normal vital signs.  Labs revealed mild hyponatremia 134 and hypochloremia of 89 with mild hyperkalemia 5.4, BUN of 102 and creatinine of 9.62 with no previous levels to compare.  Calcium was 8.5 and anion gap 30.  Lactic acid was 2.4 and later 2.6.  CBC was normal except for thrombocytosis of 461. EKG as reviewed by me : EKG showed normal sinus rhythm with rate of 71 with slightly poor R wave progression and biatrial enlargement with left axis deviation and borderline prolonged QT interval with QTc of 500 mL. Imaging: Abdominal pelvic CT scan without contrast revealed the following no acute abnormality in the abdomen or pelvis.  The patient was given 2 L bolus of IV lactated ringer.  He will be admitted to a progressive unit bed for further evaluation and  management. PAST MEDICAL HISTORY:   Past Medical History:  Diagnosis Date   Arthritis     PAST SURGICAL HISTORY:  History reviewed. No pertinent surgical history. No previous surgeries. SOCIAL HISTORY:   Social History   Tobacco Use   Smoking status: Every Day    Current packs/day: 1.00    Types: Cigarettes   Smokeless tobacco: Not on file  Substance Use Topics   Alcohol use: Yes    Comment: occassionally     FAMILY HISTORY:  Positive for MI in his father who also had end-stage renal disease on hemodialysis.  DRUG ALLERGIES:  No Known Allergies  REVIEW OF SYSTEMS:   ROS As per history of present illness. All pertinent systems were reviewed above. Constitutional, HEENT, cardiovascular, respiratory, GI, GU, musculoskeletal, neuro, psychiatric, endocrine, integumentary and hematologic systems were reviewed and are otherwise negative/unremarkable except for positive findings mentioned above in the HPI.   MEDICATIONS AT HOME:   Prior to Admission medications   Medication Sig Start Date End Date Taking? Authorizing Provider  oxyCODONE -acetaminophen  (ROXICET) 5-325 MG per tablet Take 1 tablet by mouth every 6 (six) hours as needed for severe pain. Patient not taking: Reported on 02/15/2024 10/13/14   Floy Roberts, MD  traMADol  (ULTRAM ) 50 MG tablet Take 1 tablet (50 mg total) by mouth every 6 (six) hours as needed. Patient not taking: Reported on 02/15/2024 02/17/15   Saunders Shona CROME, PA-C  VITAL SIGNS:  Blood pressure (!) 161/101, pulse 74, temperature 98.4 F (36.9 C), resp. rate 14, weight 57.2 kg, SpO2 100%.  PHYSICAL EXAMINATION:  Physical Exam  GENERAL:  63 y.o.-year-old Caucasian male patient lying in the bed with no acute distress.  EYES: Pupils equal, round, reactive to light and accommodation. No scleral icterus. Extraocular muscles intact.  HEENT: Head atraumatic, normocephalic. Oropharynx and nasopharynx clear.  NECK:  Supple, no jugular venous  distention. No thyroid enlargement, no tenderness.  LUNGS: Normal breath sounds bilaterally, no wheezing, rales,rhonchi or crepitation. No use of accessory muscles of respiration.  CARDIOVASCULAR: Regular rate and rhythm, S1, S2 normal. No murmurs, rubs, or gallops.  ABDOMEN: Soft, nondistended, nontender. Bowel sounds present. No organomegaly or mass.  EXTREMITIES: No pedal edema, cyanosis, or clubbing.  NEUROLOGIC: Cranial nerves II through XII are intact. Muscle strength 5/5 in all extremities. Sensation intact. Gait not checked.  PSYCHIATRIC: The patient is alert and oriented x 3.  Normal affect and good eye contact. SKIN: No obvious rash, lesion, or ulcer.   LABORATORY PANEL:   CBC Recent Labs  Lab 02/15/24 1606  WBC 10.5  HGB 16.5  HCT 48.0  PLT 461*   ------------------------------------------------------------------------------------------------------------------  Chemistries  Recent Labs  Lab 02/15/24 1755  NA 134*  K 5.4*  CL 89*  CO2 15*  GLUCOSE 105*  BUN 102*  CREATININE 9.62*  CALCIUM 8.4*  AST 19  ALT 15  ALKPHOS 92  BILITOT 0.8   ------------------------------------------------------------------------------------------------------------------  Cardiac Enzymes No results for input(s): TROPONINI in the last 168 hours. ------------------------------------------------------------------------------------------------------------------  RADIOLOGY:  CT ABDOMEN PELVIS WO CONTRAST Result Date: 02/15/2024 EXAM: CT ABDOMEN AND PELVIS WITHOUT CONTRAST 02/15/2024 07:03:57 PM TECHNIQUE: CT of the abdomen and pelvis was performed without the administration of intravenous contrast. Multiplanar reformatted images are provided for review. Automated exposure control, iterative reconstruction, and/or weight-based adjustment of the mA/kV was utilized to reduce the radiation dose to as low as reasonably achievable. COMPARISON: None available. CLINICAL HISTORY: Abdominal  pain, acute, nonlocalized. Pt to ED via [facility] from home. C/C abdominal pain and distention. Pt has not had BM in 4-5 days. EMS reports pt has taken laxatives without relief. EMS reports axillary temp of 93.3. EMS states the home was not cold. FINDINGS: LOWER CHEST: No acute abnormality. LIVER: The liver is unremarkable. GALLBLADDER AND BILE DUCTS: Gallbladder is unremarkable. No biliary ductal dilatation. SPLEEN: No acute abnormality. PANCREAS: No acute abnormality. ADRENAL GLANDS: No acute abnormality. KIDNEYS, URETERS AND BLADDER: No stones in the kidneys or ureters. No hydronephrosis. No perinephric or periureteral stranding. Foley catheter and gas is present in the bladder. GI AND BOWEL: Stomach demonstrates no acute abnormality. There is no bowel obstruction. Colonic diverticulosis without evidence of diverticulitis. PERITONEUM AND RETROPERITONEUM: No ascites. No free air. VASCULATURE: Aorta is normal in caliber. Aortic atherosclerotic calcification. LYMPH NODES: No lymphadenopathy. REPRODUCTIVE ORGANS: No acute abnormality. BONES AND SOFT TISSUES: No acute osseous abnormality. No focal soft tissue abnormality. IMPRESSION: 1. No acute abnormality in the abdomen or pelvis. Paucity of intraabdominal fat in combination with lack of IV or oral contrast limits assessment. Electronically signed by: Norman Gatlin MD 02/15/2024 07:56 PM EDT RP Workstation: [id number]      IMPRESSION AND PLAN:  Assessment and Plan: * AKI (acute kidney injury) - This is clearly associated with urinary retention.  It is unclear if it is AKI on top of chronic kidney disease since the patient has not been seen or sought medical care for a while. - Will  be admitted to a progressive unit bed. - Will continue hydration with IV normal saline. - Will follow BMP and avoid nephrotoxins. - Renal ultrasound will be obtained. - Nephrology consult to be obtained. - I will notify Dr. Korrapti about the patient. - The patient may need  urology consultation as well.  I am deferring it after nephrology consult.  Hypertensive urgency -This could be related to untreated hypertension. - We will place the patient on Lopressor for now as well as as needed IV hydralazine and labetalol.  He is to be on Norvasc. - We will monitor BP.     DVT prophylaxis: SQ heparin. Advanced Care Planning:  Code Status: full code. Family Communication:  The plan of care was discussed in details with the patient (and family). I answered all questions. The patient agreed to proceed with the above mentioned plan. Further management will depend upon hospital course. Disposition Plan: Back to previous home environment Consults called: Nephrology. All the records are reviewed and case discussed with ED provider.  Status is: Inpatient   At the time of the admission, it appears that the appropriate admission status for this patient is inpatient.  This is judged to be reasonable and necessary in order to provide the required intensity of service to ensure the patient's safety given the presenting symptoms, physical exam findings and initial radiographic and laboratory data in the context of comorbid conditions.  The patient requires inpatient status due to high intensity of service, high risk of further deterioration and high frequency of surveillance required.  I certify that at the time of admission, it is my clinical judgment that the patient will require inpatient hospital care extending more than 2 midnights.                            Dispo: The patient is from: Home              Anticipated d/c is to: Home              Patient currently is not medically stable to d/c.              Difficult to place patient: No  Madison DELENA Peaches M.D on 02/15/2024 at 10:56 PM  [facility]   From 7 PM-7 AM, contact night-coverage [email or web address]  CC: Primary care physician; Pcp, No

## 2024-02-15 NOTE — ED Notes (Signed)
 Md made aware of lactic 2.4 at this time

## 2024-02-15 NOTE — ED Provider Notes (Signed)
 [facility] Provider Note    Event Date/Time   First MD Initiated Contact with Patient 02/15/24 1604     (approximate)   History   No chief complaint on file.   HPI  Francisco Woods is a 63 y.o. male with rheumatoid arthritis who presents with lower abdominal pain over the last 5 days, gradual onset, worsening course, associated with distention.  The patient states that he also has not been able to urinate in the last 3 or 4 days.  He is passing gas.  He has not had a bowel movement in several days.  He reports nausea but no vomiting.  I reviewed the past medical records.  The patient's most recent outpatient encounter was at the [facility] ED on 2/15 of last year with testicular pain and dysuria.  He was diagnosed with epididymitis.   Physical Exam   Triage Vital Signs: ED Triage Vitals  Encounter Vitals Group     BP 02/15/24 1557 (!) 169/118     Girls Systolic BP Percentile --      Girls Diastolic BP Percentile --      Boys Systolic BP Percentile --      Boys Diastolic BP Percentile --      Pulse Rate 02/15/24 1557 76     Resp 02/15/24 1557 14     Temp --      Temp src --      SpO2 02/15/24 1557 96 %     Weight 02/15/24 1553 126 lb 1.7 oz (57.2 kg)     Height --      Head Circumference --      Peak Flow --      Pain Score 02/15/24 1553 0     Pain Loc --      Pain Education --      Exclude from Growth Chart --     Most recent vital signs: Vitals:   02/15/24 1845 02/15/24 1956  BP: (!) 167/100 (!) 161/101  Pulse: 79 85  Resp: 14 14  Temp: (!) 97 F (36.1 C) 98 F (36.7 C)  SpO2: 100% 100%     General: Alert, uncomfortable appearing, no distress.  CV:  Good peripheral perfusion.  Resp:  Normal effort.  Abd:  Suprapubic distention and tenderness. Other:  Jaundice or scleral icterus.  Somewhat dry mucous membranes.   ED Results / Procedures / Treatments   Labs (all labs ordered are listed, but only abnormal results are  displayed) Labs Reviewed  CBC - Abnormal; Notable for the following components:      Result Value   Platelets 461 (*)    All other components within normal limits  URINALYSIS, ROUTINE W REFLEX MICROSCOPIC - Abnormal; Notable for the following components:   Color, Urine YELLOW (*)    APPearance CLEAR (*)    Hgb urine dipstick MODERATE (*)    All other components within normal limits  LACTIC ACID, PLASMA - Abnormal; Notable for the following components:   Lactic Acid, Venous 2.4 (*)    All other components within normal limits  LACTIC ACID, PLASMA - Abnormal; Notable for the following components:   Lactic Acid, Venous 2.6 (*)    All other components within normal limits  COMPREHENSIVE METABOLIC PANEL WITH GFR - Abnormal; Notable for the following components:   Sodium 134 (*)    Potassium 5.4 (*)    Chloride 89 (*)    CO2 15 (*)    Glucose, Bld 105 (*)  BUN 102 (*)    Creatinine, Ser 9.62 (*)    Calcium 8.4 (*)    Total Protein 9.0 (*)    GFR, Estimated 6 (*)    Anion gap 30 (*)    All other components within normal limits  CBG MONITORING, ED - Abnormal; Notable for the following components:   Glucose-Capillary 60 (*)    All other components within normal limits  CBG MONITORING, ED - Abnormal; Notable for the following components:   Glucose-Capillary 102 (*)    All other components within normal limits  LIPASE, BLOOD     EKG  ED ECG REPORT I, Waylon Cassis, the attending physician, personally viewed and interpreted this ECG.  Date: 02/15/2024 EKG Time: 1624 Rate: 71 Rhythm: normal sinus rhythm QRS Axis: Left axis Intervals: normal ST/T Wave abnormalities: normal Narrative Interpretation: no evidence of acute ischemia    RADIOLOGY  CT ab/pelvis: I independently viewed and interpreted the images; there are no dilated bowels or any free air or free fluid.  Radiology report indicates no acute abnormality.  PROCEDURES:  Critical Care performed:  No  Procedures   MEDICATIONS ORDERED IN ED: Medications  lactated ringers bolus 1,000 mL (0 mLs Intravenous Stopped 02/15/24 1842)  lactated ringers bolus 1,000 mL (1,000 mLs Intravenous New Bag/Given 02/15/24 2019)     IMPRESSION / MDM / ASSESSMENT AND PLAN / ED COURSE  I reviewed the triage vital signs and the nursing notes.  63 year old male with PMH as noted above presents with lower abdominal pain and distention over the last several days along with urinary retention nausea.  He is constipated, but on exam the distention is really only in the suprapubic area.   Differential diagnosis includes, but is not limited to, acute urinary retention, UTI/cystitis, less likely bowel obstruction, volvulus, colitis, diverticulitis.  We will obtain lab workup and attempt to place Foley catheter.  If this does not resolve the symptoms he may need imaging.  Patient's presentation is most consistent with acute complicated illness / injury requiring diagnostic workup.  ----------------------------------------- 8:19 PM on 02/15/2024 -----------------------------------------  With placement of the Foley, the patient had output of over a liter of urine.  He still has some lower abdominal pain so we obtained a CT which is negative for acute findings.  Urinalysis is negative except for some RBCs.  However, lab workup is significant for severe AKI with a creatinine of 9.6.  Potassium is borderline elevated but there are no concerning EKG changes.  CBC is unremarkable.  Initial and repeat lactate are both slightly elevated.  The patient was borderline hypothermic, but his vitals are otherwise stable.  He has no leukocytosis.  There is no clinical evidence for sepsis.  He will need admission for further management of his AKI.  I consulted Dr. Lawence from the hospitalist service; based on our discussion he agrees to evaluate the patient for admission.   FINAL CLINICAL IMPRESSION(S) / ED DIAGNOSES   Final  diagnoses:  Urinary retention  AKI (acute kidney injury)     Rx / DC Orders   ED Discharge Orders     None        Note:  This document was prepared using Dragon voice recognition software and may include unintentional dictation errors.    Cassis Waylon, MD 02/15/24 2020

## 2024-02-15 NOTE — ED Triage Notes (Signed)
 Pt to ED via [facility] from home. C/C abdominal pain and distention. Pt has not had BM in 4-5 days. EMS reports pt has taken laxatives without relief. EMS reports axillary temp of 93.3. EMS states the home was not cold.   CBG 93 BP 161/105 HR 90 SPO2 95%

## 2024-02-15 NOTE — Assessment & Plan Note (Addendum)
-  This could be related to untreated hypertension. - We will place the patient on Lopressor for now as well as as needed IV hydralazine and labetalol.  He is to be on Norvasc. - We will monitor BP.

## 2024-02-15 NOTE — Assessment & Plan Note (Addendum)
-   This is clearly associated with urinary retention.  It is unclear if it is AKI on top of chronic kidney disease since the patient has not been seen or sought medical care for a while. - Will be admitted to a progressive unit bed. - Will continue hydration with IV normal saline. - Will follow BMP and avoid nephrotoxins. - Renal ultrasound will be obtained. - Nephrology consult to be obtained. - I will notify Dr. Korrapti about the patient. - The patient may need urology consultation as well.  I am deferring it after nephrology consult.

## 2024-02-16 ENCOUNTER — Encounter: Payer: Self-pay | Admitting: Family Medicine

## 2024-02-16 DIAGNOSIS — N179 Acute kidney failure, unspecified: Secondary | ICD-10-CM | POA: Diagnosis not present

## 2024-02-16 LAB — CBC
HCT: 38.9 % — ABNORMAL LOW (ref 39.0–52.0)
Hemoglobin: 13.5 g/dL (ref 13.0–17.0)
MCH: 29.7 pg (ref 26.0–34.0)
MCHC: 34.7 g/dL (ref 30.0–36.0)
MCV: 85.7 fL (ref 80.0–100.0)
Platelets: 339 K/uL (ref 150–400)
RBC: 4.54 MIL/uL (ref 4.22–5.81)
RDW: 13.3 % (ref 11.5–15.5)
WBC: 9.2 K/uL (ref 4.0–10.5)
nRBC: 0 % (ref 0.0–0.2)

## 2024-02-16 LAB — BASIC METABOLIC PANEL WITH GFR
Anion gap: 19 — ABNORMAL HIGH (ref 5–15)
BUN: 111 mg/dL — ABNORMAL HIGH (ref 8–23)
CO2: 19 mmol/L — ABNORMAL LOW (ref 22–32)
Calcium: 7.6 mg/dL — ABNORMAL LOW (ref 8.9–10.3)
Chloride: 97 mmol/L — ABNORMAL LOW (ref 98–111)
Creatinine, Ser: 6.45 mg/dL — ABNORMAL HIGH (ref 0.61–1.24)
GFR, Estimated: 9 mL/min — ABNORMAL LOW (ref >60)
Glucose, Bld: 109 mg/dL — ABNORMAL HIGH (ref 70–99)
Potassium: 4.2 mmol/L (ref 3.5–5.1)
Sodium: 135 mmol/L (ref 135–145)

## 2024-02-16 LAB — HIV ANTIBODY (ROUTINE TESTING W REFLEX): HIV Screen 4th Generation wRfx: NONREACTIVE

## 2024-02-16 MED ORDER — ENSURE PLUS HIGH PROTEIN PO LIQD: 237.0000 mL | Freq: Two times a day (BID) | ORAL | Status: DC

## 2024-02-16 MED ORDER — ENSURE PLUS HIGH PROTEIN PO LIQD
237.0000 mL | Freq: Two times a day (BID) | ORAL | Status: DC
  Administered 2024-02-17 – 2024-02-18 (×3): 237 mL via ORAL

## 2024-02-16 MED ORDER — CHLORHEXIDINE GLUCONATE CLOTH 2 % EX PADS
6.0000 | MEDICATED_PAD | Freq: Every day | CUTANEOUS | Status: DC
  Administered 2024-02-16 – 2024-02-24 (×9): 6 via TOPICAL

## 2024-02-16 NOTE — ED Notes (Signed)
Pt asleep, no needs at this time.

## 2024-02-16 NOTE — ED Notes (Signed)
 Beverage and snack given

## 2024-02-16 NOTE — Consult Note (Signed)
 [facility] CONSULT NOTE    Date: 02/16/2024                  Patient Name:  Francisco Woods  MRN: [id number]  DOB: 07-Jun-1960  Age / Sex: 63 y.o., male         PCP: Pcp, No                 Service Requesting Consult:                  Reason for Consult: Acute kidney injury:            History of Present Illness: Patient is a 63 y.o. male with a PMHx of hypertension, benign prostate hypertrophy, history of tobacco use, osteoarthritis with history of nonsteroidal anti-inflammatory drug use now sent to the emergency room with acute abdominal pain and decreased urine output for the last 4 days.  He had a CT scan which did not reveal any evidence of hydronephrosis.  Patient was deemed to have urinary retention and had a Foley catheter placed.  He had about 3 L of urine output since Foley insertion.  He was also given IV fluids with isotonic saline.  He was found to have a creatinine of 9.62 initially with hyperkalemia.  Subsequently the creatinine has improved towards the sixes.  Medications: Outpatient medications: (Not in a hospital admission)   Discontinued Meds:  There are no discontinued medications.  Current medications: Current Facility-Administered Medications  Medication Dose Route Frequency Provider Last Rate Last Admin   0.9 %  sodium chloride infusion   Intravenous Continuous Mansy, Madison LABOR, MD 125 mL/hr at 02/16/24 0718 New Bag at 02/16/24 0718   acetaminophen  (TYLENOL ) tablet 650 mg  650 mg Oral Q6H PRN Mansy, Jan A, MD   650 mg at 02/16/24 0450   Or   acetaminophen  (TYLENOL ) suppository 650 mg  650 mg Rectal Q6H PRN Mansy, Jan A, MD       heparin injection 5,000 Units  5,000 Units Subcutaneous Q8H Mansy, Jan A, MD       magnesium hydroxide (MILK OF MAGNESIA) suspension 30 mL  30 mL Oral Daily PRN Mansy, Jan A, MD       ondansetron (ZOFRAN) tablet 4 mg  4 mg Oral Q6H PRN Mansy, Jan A, MD       Or   ondansetron (ZOFRAN) injection 4 mg  4 mg  Intravenous Q6H PRN Mansy, Jan A, MD       traZODone (DESYREL) tablet 25 mg  25 mg Oral QHS PRN Mansy, Madison LABOR, MD       Current Outpatient Medications  Medication Sig Dispense Refill   oxyCODONE -acetaminophen  (ROXICET) 5-325 MG per tablet Take 1 tablet by mouth every 6 (six) hours as needed for severe pain. (Patient not taking: Reported on 02/15/2024) 10 tablet 0   traMADol  (ULTRAM ) 50 MG tablet Take 1 tablet (50 mg total) by mouth every 6 (six) hours as needed. (Patient not taking: Reported on 02/15/2024) 20 tablet 0      Allergies: No Known Allergies    Past Medical History: Past Medical History:  Diagnosis Date   Arthritis      Past Surgical History: History reviewed. No pertinent surgical history.   Family History: History reviewed. No pertinent family history.   Social History: Social History   Socioeconomic History   Marital status: Married    Spouse name: Not on file   Number of children: Not on  file   Years of education: Not on file   Highest education level: Not on file  Occupational History   Not on file  Tobacco Use   Smoking status: Every Day    Current packs/day: 1.00    Types: Cigarettes   Smokeless tobacco: Not on file  Substance and Sexual Activity   Alcohol use: Yes    Comment: occassionally    Drug use: No   Sexual activity: Not on file  Other Topics Concern   Not on file  Social History Narrative   Not on file   Social Drivers of Health   Financial Resource Strain: Not on file  Food Insecurity: Not on file  Transportation Needs: Not on file  Physical Activity: Not on file  Stress: Not on file  Social Connections: Not on file  Intimate Partner Violence: Not on file     Review of Systems: As per HPI  Vital Signs: Blood pressure 129/67, pulse 78, temperature 99 F (37.2 C), resp. rate 17, weight 57.2 kg, SpO2 100%.  Weight trends: Filed Weights   02/15/24 1553  Weight: 57.2 kg    Physical Exam: Physical Exam: General:   No acute distress  Head:  Normocephalic, atraumatic. Moist oral mucosal membranes  Eyes:  Anicteric  Neck:  Supple  Lungs:   Clear to auscultation, normal effort  Heart:  S1S2 no rubs  Abdomen:   Soft, nontender, bowel sounds present  Extremities:  peripheral edema.  Neurologic:  Awake, alert, following commands  Skin:  No lesions  Access:     Lab results:  Basic Metabolic Panel: Recent Labs  Lab 02/15/24 1755 02/16/24 0422  NA 134* 135  K 5.4* 4.2  CL 89* 97*  CO2 15* 19*  GLUCOSE 105* 109*  BUN 102* 111*  CREATININE 9.62* 6.45*  CALCIUM 8.4* 7.6*    Creatinine, Ser  Date/Time Value Ref Range Status  02/16/2024 04:22 AM 6.45 (H) 0.61 - 1.24 mg/dL Final  89/82/7974 94:44 PM 9.62 (H) 0.61 - 1.24 mg/dL Final    CBC: Recent Labs  Lab 02/15/24 1606 02/16/24 0422  WBC 10.5 9.2  HGB 16.5 13.5  HCT 48.0 38.9*  MCV 86.3 85.7  PLT 461* 339    Microbiology: No results found for this or any previous visit.  Urinalysis: Recent Labs    02/15/24 1646  COLORURINE YELLOW*  LABSPEC 1.011  PHURINE 5.0  GLUCOSEU NEGATIVE  HGBUR MODERATE*  BILIRUBINUR NEGATIVE  KETONESUR NEGATIVE  PROTEINUR NEGATIVE  NITRITE NEGATIVE  LEUKOCYTESUR NEGATIVE     Imaging:  CT ABDOMEN PELVIS WO CONTRAST Result Date: 02/15/2024 EXAM: CT ABDOMEN AND PELVIS WITHOUT CONTRAST 02/15/2024 07:03:57 PM TECHNIQUE: CT of the abdomen and pelvis was performed without the administration of intravenous contrast. Multiplanar reformatted images are provided for review. Automated exposure control, iterative reconstruction, and/or weight-based adjustment of the mA/kV was utilized to reduce the radiation dose to as low as reasonably achievable. COMPARISON: None available. CLINICAL HISTORY: Abdominal pain, acute, nonlocalized. Pt to ED via [facility] from home. C/C abdominal pain and distention. Pt has not had BM in 4-5 days. EMS reports pt has taken laxatives without relief. EMS reports axillary temp of 93.3.  EMS states the home was not cold. FINDINGS: LOWER CHEST: No acute abnormality. LIVER: The liver is unremarkable. GALLBLADDER AND BILE DUCTS: Gallbladder is unremarkable. No biliary ductal dilatation. SPLEEN: No acute abnormality. PANCREAS: No acute abnormality. ADRENAL GLANDS: No acute abnormality. KIDNEYS, URETERS AND BLADDER: No stones in the kidneys or ureters. No  hydronephrosis. No perinephric or periureteral stranding. Foley catheter and gas is present in the bladder. GI AND BOWEL: Stomach demonstrates no acute abnormality. There is no bowel obstruction. Colonic diverticulosis without evidence of diverticulitis. PERITONEUM AND RETROPERITONEUM: No ascites. No free air. VASCULATURE: Aorta is normal in caliber. Aortic atherosclerotic calcification. LYMPH NODES: No lymphadenopathy. REPRODUCTIVE ORGANS: No acute abnormality. BONES AND SOFT TISSUES: No acute osseous abnormality. No focal soft tissue abnormality. IMPRESSION: 1. No acute abnormality in the abdomen or pelvis. Paucity of intraabdominal fat in combination with lack of IV or oral contrast limits assessment. Electronically signed by: Norman Gatlin MD 02/15/2024 07:56 PM EDT RP Workstation: [id number]     Assessment & Plan:  63 y.o. male with a PMHx of hypertension, benign prostate hypertrophy, history of tobacco use, osteoarthritis with history of nonsteroidal anti-inflammatory drug use now sent to the emergency room with acute abdominal pain and decreased urine output for the last 4 days.  He had a CT scan which did not reveal any evidence of hydronephrosis.  Patient was deemed to have urinary retention and had a Foley catheter placed.  He had about 3 L of urine output since Foley insertion.  He was also given IV fluids with isotonic saline.  He was found to have a creatinine of 9.62 initially with hyperkalemia.  Subsequently the creatinine has improved towards the sixes.   Principal Problem:   AKI (acute kidney injury) Active Problems:    Hypertensive urgency  #1: Acute kidney injury: Acute kidney injury is most likely secondary to urinary retention.  Patient is status post Foley catheter placement.  So far he had about 3.6 L of urine output.  Renal indices are slowly but steadily improving.  #2: Hypertension: Blood pressure is significantly improved after placing Foley catheter.  We can resume the antihypertensives as needed.  Labs and medications reviewed. Will continue to monitor closely.  LOS: 1 Pinkey Edman, MD [facility]. 10/18/20251:15 PM

## 2024-02-16 NOTE — Progress Notes (Signed)
 Progress Note   Patient: Francisco Woods FMW:[id number] DOB: [birth year] DOA: 02/15/2024     1 DOS: the patient was seen and examined on 02/16/2024   Brief hospital course: From HPI Francisco Woods is a 63 y.o. Caucasian male with medical history significant for osteoarthritis, BPH and tobacco abuse, who presented to the emergency room with acute onset of lower abdominal pain with significant diminished urine output over the last 4 days.  He admitted to nausea without vomiting or diarrhea.  He has been having tactile fever and chills.  He denied any chest pain or palpitations.  No cough or wheezing or dyspnea.  He admits to mild headache and dizziness.  The last time he saw physician was 3 years ago.  He denies any history of hypertension, diabetes mellitus or chronic kidney disease.   After insertion of a Foley catheter in the ED, the patient had more than 1 L of urine output and felt much better.   ED Course: When the patient came to the ER, BP was 169/118 and later 160/94 with otherwise normal vital signs.  Labs revealed mild hyponatremia 134 and hypochloremia of 89 with mild hyperkalemia 5.4, BUN of 102 and creatinine of 9.62 with no previous levels to compare.  Calcium was 8.5 and anion gap 30.  Lactic acid was 2.4 and later 2.6.  CBC was normal except for thrombocytosis of 461. EKG as reviewed by me : EKG showed normal sinus rhythm with rate of 71 with slightly poor R wave progression and biatrial enlargement with left axis deviation and borderline prolonged QT interval with QTc of 500 mL. Imaging: Abdominal pelvic CT scan without contrast revealed the following no acute abnormality in the abdomen or pelvis.   The patient was given 2 L bolus of IV lactated ringer.  He will be admitted to a progressive unit bed for further evaluation and management.    Assessment and Plan: AKI (acute kidney injury) - This is clearly associated with urinary retention.  After Foley catheterization 3.6 L of  urine drained Renal function is improving Continue IV hydration Nephrologist on board and case discussed Monitor renal indicis Avoid nephrotoxic drugs Renally dose all medications   Hypertensive urgency Blood pressure improved Blood pressure currently borderline hypotensive We will avoid any antihypertensives at this time       DVT prophylaxis: SQ heparin.  Advanced Care Planning:  Code Status: full code.  Family Communication: None present at bedside  Disposition Plan: Back to previous home environment  Consults called: Nephrology.     Subjective:  Patient seen and examined at bedside this morning Denies nausea vomiting abdominal pain chest pain cough Renal function improving  Physical Exam:  GENERAL:  63 y.o.-year-old Caucasian male patient lying in the bed with no acute distress.  EYES: Pupils equal, round, reactive to light and accommodation. No scleral icterus. Extraocular muscles intact.  HEENT: Head atraumatic, normocephalic. Oropharynx and nasopharynx clear.  NECK:  Supple, no jugular venous distention. No thyroid enlargement, no tenderness.  LUNGS: Normal breath sounds bilaterally, no wheezing, rales,rhonchi or crepitation. No use of accessory muscles of respiration.  CARDIOVASCULAR: Regular rate and rhythm, S1, S2 normal. No murmurs, rubs, or gallops.  ABDOMEN: Soft, nondistended, nontender. Bowel sounds present. No organomegaly or mass.  EXTREMITIES: No pedal edema, cyanosis, or clubbing.  NEUROLOGIC: Cranial nerves II through XII are intact. Muscle strength 5/5 in all extremities. Sensation intact. Gait not checked.  PSYCHIATRIC: The patient is alert and oriented x 3.  Normal affect  and good eye contact. SKIN: No obvious rash, lesion, or ulcer.   Vitals:   02/16/24 1300 02/16/24 1400 02/16/24 1432 02/16/24 1505  BP: 129/67 125/67 114/66 114/66  Pulse: 78 74  72  Resp: 17 17  17   Temp: 99 F (37.2 C) 98.1 F (36.7 C) 97.6 F (36.4 C) 97.6 F (36.4 C)   TempSrc:   Oral Oral  SpO2: 100% 98%  100%  Weight:  59 kg    Height:    6' (1.829 m)    Data Reviewed:     Latest Ref Rng & Units 02/16/2024    4:22 AM 02/15/2024    4:06 PM  CBC  WBC 4.0 - 10.5 K/uL 9.2  10.5   Hemoglobin 13.0 - 17.0 g/dL 86.4  83.4   Hematocrit 39.0 - 52.0 % 38.9  48.0   Platelets 150 - 400 K/uL 339  461        Latest Ref Rng & Units 02/16/2024    4:22 AM 02/15/2024    5:55 PM  BMP  Glucose 70 - 99 mg/dL 890  894   BUN 8 - 23 mg/dL 888  897   Creatinine 9.38 - 1.24 mg/dL 3.54  0.37   Sodium 864 - 145 mmol/L 135  134   Potassium 3.5 - 5.1 mmol/L 4.2  5.4   Chloride 98 - 111 mmol/L 97  89   CO2 22 - 32 mmol/L 19  15   Calcium 8.9 - 10.3 mg/dL 7.6  8.4       Time spent: 51 minutes  Author: Drue ONEIDA Potter, MD 02/16/2024 4:21 PM  For on call review [email or web address].

## 2024-02-16 NOTE — Plan of Care (Signed)

## 2024-02-17 DIAGNOSIS — N179 Acute kidney failure, unspecified: Secondary | ICD-10-CM | POA: Diagnosis not present

## 2024-02-17 LAB — BASIC METABOLIC PANEL WITH GFR
Anion gap: 7 (ref 5–15)
BUN: 89 mg/dL — ABNORMAL HIGH (ref 8–23)
CO2: 24 mmol/L (ref 22–32)
Calcium: 7.3 mg/dL — ABNORMAL LOW (ref 8.9–10.3)
Chloride: 104 mmol/L (ref 98–111)
Creatinine, Ser: 2.15 mg/dL — ABNORMAL HIGH (ref 0.61–1.24)
GFR, Estimated: 34 mL/min — ABNORMAL LOW (ref >60)
Glucose, Bld: 101 mg/dL — ABNORMAL HIGH (ref 70–99)
Potassium: 3.5 mmol/L (ref 3.5–5.1)
Sodium: 135 mmol/L (ref 135–145)

## 2024-02-17 LAB — CBC WITH DIFFERENTIAL/PLATELET
Abs Immature Granulocytes: 0.02 K/uL (ref 0.00–0.07)
Basophils Absolute: 0 K/uL (ref 0.0–0.1)
Basophils Relative: 1 %
Eosinophils Absolute: 0.3 K/uL (ref 0.0–0.5)
Eosinophils Relative: 5 %
HCT: 34.4 % — ABNORMAL LOW (ref 39.0–52.0)
Hemoglobin: 11.8 g/dL — ABNORMAL LOW (ref 13.0–17.0)
Immature Granulocytes: 0 %
Lymphocytes Relative: 15 %
Lymphs Abs: 0.9 K/uL (ref 0.7–4.0)
MCH: 29.8 pg (ref 26.0–34.0)
MCHC: 34.3 g/dL (ref 30.0–36.0)
MCV: 86.9 fL (ref 80.0–100.0)
Monocytes Absolute: 0.7 K/uL (ref 0.1–1.0)
Monocytes Relative: 12 %
Neutro Abs: 4 K/uL (ref 1.7–7.7)
Neutrophils Relative %: 67 %
Platelets: 292 K/uL (ref 150–400)
RBC: 3.96 MIL/uL — ABNORMAL LOW (ref 4.22–5.81)
RDW: 13.2 % (ref 11.5–15.5)
WBC: 6 K/uL (ref 4.0–10.5)
nRBC: 0 % (ref 0.0–0.2)

## 2024-02-17 LAB — IRON AND TIBC
Iron: 78 ug/dL (ref 45–182)
Saturation Ratios: 27 % (ref 17.9–39.5)
TIBC: 293 ug/dL (ref 250–450)
UIBC: 215 ug/dL

## 2024-02-17 LAB — FOLATE: Folate: 10.3 ng/mL (ref >5.9)

## 2024-02-17 LAB — VITAMIN B12: Vitamin B-12: 353 pg/mL (ref 180–914)

## 2024-02-17 LAB — VITAMIN D 25 HYDROXY (VIT D DEFICIENCY, FRACTURES): Vit D, 25-Hydroxy: 49.55 ng/mL (ref 30–100)

## 2024-02-17 MED ORDER — TRAZODONE HCL 50 MG PO TABS
50.0000 mg | ORAL_TABLET | Freq: Every day | ORAL | Status: DC
  Administered 2024-02-18 – 2024-02-23 (×6): 50 mg via ORAL
  Filled 2024-02-17 (×6): qty 1

## 2024-02-17 MED ORDER — OXYCODONE HCL 5 MG PO TABS
5.0000 mg | ORAL_TABLET | Freq: Four times a day (QID) | ORAL | Status: DC | PRN
  Administered 2024-02-18 – 2024-02-23 (×5): 5 mg via ORAL
  Filled 2024-02-17 (×5): qty 1

## 2024-02-17 MED ORDER — VITAMIN B-12 1000 MCG PO TABS
1000.0000 ug | ORAL_TABLET | Freq: Every day | ORAL | Status: DC
  Administered 2024-02-17 – 2024-02-24 (×8): 1000 ug via ORAL
  Filled 2024-02-17 (×8): qty 1

## 2024-02-17 NOTE — Plan of Care (Signed)

## 2024-02-17 NOTE — Progress Notes (Signed)
 Progress Note   Patient: Francisco Woods FMW:[id number] DOB: 1960-10-08 DOA: 02/15/2024     2 DOS: the patient was seen and examined on 02/17/2024   Brief hospital course: From HPI Francisco Woods is a 63 y.o. Caucasian male with medical history significant for osteoarthritis, BPH and tobacco abuse, who presented to the emergency room with acute onset of lower abdominal pain with significant diminished urine output over the last 4 days.  He admitted to nausea without vomiting or diarrhea.  He has been having tactile fever and chills.  He denied any chest pain or palpitations.  No cough or wheezing or dyspnea.  He admits to mild headache and dizziness.  The last time he saw physician was 3 years ago.  He denies any history of hypertension, diabetes mellitus or chronic kidney disease.   After insertion of a Foley catheter in the ED, the patient had more than 1 L of urine output and felt much better.   ED Course: When the patient came to the ER, BP was 169/118 and later 160/94 with otherwise normal vital signs.  Labs revealed mild hyponatremia 134 and hypochloremia of 89 with mild hyperkalemia 5.4, BUN of 102 and creatinine of 9.62 with no previous levels to compare.  Calcium was 8.5 and anion gap 30.  Lactic acid was 2.4 and later 2.6.  CBC was normal except for thrombocytosis of 461. EKG as reviewed by me : EKG showed normal sinus rhythm with rate of 71 with slightly poor R wave progression and biatrial enlargement with left axis deviation and borderline prolonged QT interval with QTc of 500 mL. Imaging: Abdominal pelvic CT scan without contrast revealed the following no acute abnormality in the abdomen or pelvis.   The patient was given 2 L bolus of IV lactated ringer.  He will be admitted to a progressive unit bed for further evaluation and management.    Assessment and Plan:  # AKI (acute kidney injury) - Secondary to urinary retention.  After Foley catheterization 3.6 L of urine  drained Renal function is improving S/p IV hydration.  Continue oral hydration Nephrologist on board and case discussed Monitor renal indicis Avoid nephrotoxic drugs Renally dose all medications   Hypertensive urgency Blood pressure improved Currently stable without medications. Monitor BP and titrate medication accordingly  # Vitamin B12 level 353, goal >400.  Started vitamin B12 1000 mcg p.o. daily.  Follow-up with PCP to repeat B12 level after 3 to 6 months.   DVT prophylaxis: SQ heparin.  Advanced Care Planning:  Code Status: full code.  Family Communication: None present at bedside  Disposition Plan: Back to previous home environment  Consults called: Nephrology.     Subjective:  Patient seen and examined at bedside this morning Denies nausea vomiting abdominal pain chest pain cough Renal function improving  Physical Exam:  GENERAL:  63 y.o.-year-old Caucasian male patient lying in the bed with no acute distress.  EYES: Pupils equal, round, reactive to light and accommodation. No scleral icterus. Extraocular muscles intact.  HEENT: Head atraumatic, normocephalic. Oropharynx and nasopharynx clear.  NECK:  Supple, no jugular venous distention. No thyroid enlargement, no tenderness.  LUNGS: Normal breath sounds bilaterally, no wheezing, rales,rhonchi or crepitation. No use of accessory muscles of respiration.  CARDIOVASCULAR: Regular rate and rhythm, S1, S2 normal. No murmurs, rubs, or gallops.  ABDOMEN: Soft, nondistended, nontender. Bowel sounds present. No organomegaly or mass.  EXTREMITIES: No pedal edema, cyanosis, or clubbing.  NEUROLOGIC: Cranial nerves II through XII are  intact. Muscle strength 5/5 in all extremities. Sensation intact. Gait not checked.  PSYCHIATRIC: The patient is alert and oriented x 3.  Normal affect and good eye contact. SKIN: No obvious rash, lesion, or ulcer.   Vitals:   02/16/24 2040 02/17/24 0001 02/17/24 0511 02/17/24 0832  BP:  110/61 126/64 128/81 137/86  Pulse: 80 76 68 64  Resp: 18 18 18 15   Temp: 98.1 F (36.7 C) 97.6 F (36.4 C) 97.8 F (36.6 C) 97.9 F (36.6 C)  TempSrc:      SpO2: 99% 100% 99% 100%  Weight:      Height:        Data Reviewed:     Latest Ref Rng & Units 02/17/2024    4:45 AM 02/16/2024    4:22 AM 02/15/2024    4:06 PM  CBC  WBC 4.0 - 10.5 K/uL 6.0  9.2  10.5   Hemoglobin 13.0 - 17.0 g/dL 88.1  86.4  83.4   Hematocrit 39.0 - 52.0 % 34.4  38.9  48.0   Platelets 150 - 400 K/uL 292  339  461        Latest Ref Rng & Units 02/17/2024    4:45 AM 02/16/2024    4:22 AM 02/15/2024    5:55 PM  BMP  Glucose 70 - 99 mg/dL 898  890  894   BUN 8 - 23 mg/dL 89  888  897   Creatinine 0.61 - 1.24 mg/dL 7.84  3.54  0.37   Sodium 135 - 145 mmol/L 135  135  134   Potassium 3.5 - 5.1 mmol/L 3.5  4.2  5.4   Chloride 98 - 111 mmol/L 104  97  89   CO2 22 - 32 mmol/L 24  19  15    Calcium 8.9 - 10.3 mg/dL 7.3  7.6  8.4        Time spent: 40 minutes  Author: Elvan Sor, MD 02/17/2024 2:32 PM  For on call review [email or web address].

## 2024-02-17 NOTE — Progress Notes (Signed)
 [facility]  PROGRESS NOTE   Subjective:   Awake and alert.  Feels much better today.  Urine output 3.8 L with  Objective:  Vital signs: Blood pressure 137/86, pulse 64, temperature 97.9 F (36.6 C), resp. rate 15, height 6' (1.829 m), weight 59 kg, SpO2 100%.  Intake/Output Summary (Last 24 hours) at 02/17/2024 1434 Last data filed at 02/17/2024 1345 Gross per 24 hour  Intake 3159.04 ml  Output 3825 ml  Net -665.96 ml   Filed Weights   02/15/24 1553 02/16/24 1400  Weight: 57.2 kg 59 kg     Physical Exam: General:  No acute distress  Head:  Normocephalic, atraumatic. Moist oral mucosal membranes  Eyes:  Anicteric  Neck:  Supple  Lungs:   Clear to auscultation, normal effort  Heart:  S1S2 no rubs  Abdomen:   Soft, nontender, bowel sounds present  Extremities:  peripheral edema.  Neurologic:  Awake, alert, following commands  Skin:  No lesions  Access:     Basic Metabolic Panel: Recent Labs  Lab 02/15/24 1755 02/16/24 0422 02/17/24 0445  NA 134* 135 135  K 5.4* 4.2 3.5  CL 89* 97* 104  CO2 15* 19* 24  GLUCOSE 105* 109* 101*  BUN 102* 111* 89*  CREATININE 9.62* 6.45* 2.15*  CALCIUM 8.4* 7.6* 7.3*   GFR: Estimated Creatinine Clearance: 29.3 mL/min (A) (by C-G formula based on SCr of 2.15 mg/dL (H)).  Liver Function Tests: Recent Labs  Lab 02/15/24 1755  AST 19  ALT 15  ALKPHOS 92  BILITOT 0.8  PROT 9.0*  ALBUMIN 4.0   Recent Labs  Lab 02/15/24 1755  LIPASE 48   No results for input(s): AMMONIA in the last 168 hours.  CBC: Recent Labs  Lab 02/15/24 1606 02/16/24 0422 02/17/24 0445  WBC 10.5 9.2 6.0  NEUTROABS  --   --  4.0  HGB 16.5 13.5 11.8*  HCT 48.0 38.9* 34.4*  MCV 86.3 85.7 86.9  PLT 461* 339 292     HbA1C: No results found for: HGBA1C  Urinalysis: Recent Labs    02/15/24 1646  COLORURINE YELLOW*  LABSPEC 1.011  PHURINE 5.0  GLUCOSEU NEGATIVE  HGBUR MODERATE*  BILIRUBINUR NEGATIVE  KETONESUR  NEGATIVE  PROTEINUR NEGATIVE  NITRITE NEGATIVE  LEUKOCYTESUR NEGATIVE      Imaging: CT ABDOMEN PELVIS WO CONTRAST Result Date: 02/15/2024 EXAM: CT ABDOMEN AND PELVIS WITHOUT CONTRAST 02/15/2024 07:03:57 PM TECHNIQUE: CT of the abdomen and pelvis was performed without the administration of intravenous contrast. Multiplanar reformatted images are provided for review. Automated exposure control, iterative reconstruction, and/or weight-based adjustment of the mA/kV was utilized to reduce the radiation dose to as low as reasonably achievable. COMPARISON: None available. CLINICAL HISTORY: Abdominal pain, acute, nonlocalized. Pt to ED via [facility] from home. C/C abdominal pain and distention. Pt has not had BM in 4-5 days. EMS reports pt has taken laxatives without relief. EMS reports axillary temp of 93.3. EMS states the home was not cold. FINDINGS: LOWER CHEST: No acute abnormality. LIVER: The liver is unremarkable. GALLBLADDER AND BILE DUCTS: Gallbladder is unremarkable. No biliary ductal dilatation. SPLEEN: No acute abnormality. PANCREAS: No acute abnormality. ADRENAL GLANDS: No acute abnormality. KIDNEYS, URETERS AND BLADDER: No stones in the kidneys or ureters. No hydronephrosis. No perinephric or periureteral stranding. Foley catheter and gas is present in the bladder. GI AND BOWEL: Stomach demonstrates no acute abnormality. There is no bowel obstruction. Colonic diverticulosis without evidence of diverticulitis. PERITONEUM AND RETROPERITONEUM: No ascites. No  free air. VASCULATURE: Aorta is normal in caliber. Aortic atherosclerotic calcification. LYMPH NODES: No lymphadenopathy. REPRODUCTIVE ORGANS: No acute abnormality. BONES AND SOFT TISSUES: No acute osseous abnormality. No focal soft tissue abnormality. IMPRESSION: 1. No acute abnormality in the abdomen or pelvis. Paucity of intraabdominal fat in combination with lack of IV or oral contrast limits assessment. Electronically signed by: Norman Gatlin MD  02/15/2024 07:56 PM EDT RP Workstation: [id number]     Medications:     Chlorhexidine Gluconate Cloth  6 each Topical Daily   feeding supplement  237 mL Oral BID BM   heparin  5,000 Units Subcutaneous Q8H    Assessment/ Plan:     63 y.o. male with a PMHx of hypertension, benign prostate hypertrophy, history of tobacco use, osteoarthritis with history of nonsteroidal anti-inflammatory drug use now sent to the emergency room with acute abdominal pain and decreased urine output for the last 4 days.  He had a CT scan which did not reveal any evidence of hydronephrosis.  Patient was deemed to have urinary retention and had a Foley catheter placed.  He had about 3 L of urine output since Foley insertion.  He was also given IV fluids with isotonic saline.  He was found to have a creatinine of 9.62 initially with hyperkalemia.  Subsequently the creatinine has improved after Foley placement.     Principal Problem:   AKI (acute kidney injury) Active Problems:   Hypertensive urgency   #1: Acute kidney injury: Acute kidney injury is most likely secondary to urinary retention.  Patient is status post Foley catheter placement.  So far he had about 3.8 L of urine output since yesterday.  Renal indices are slowly but steadily improving.   #2: Hypertension: Blood pressure is significantly improved after placing Foley catheter.  We can resume the antihypertensives as needed.    Labs and medications reviewed. Will continue to follow along with you.   LOS: 2 Pinkey Edman, MD [facility] 10/19/20252:34 PM

## 2024-02-18 DIAGNOSIS — E43 Unspecified severe protein-calorie malnutrition: Secondary | ICD-10-CM | POA: Insufficient documentation

## 2024-02-18 DIAGNOSIS — N179 Acute kidney failure, unspecified: Secondary | ICD-10-CM | POA: Diagnosis not present

## 2024-02-18 LAB — BASIC METABOLIC PANEL WITH GFR
Anion gap: 12 (ref 5–15)
BUN: 59 mg/dL — ABNORMAL HIGH (ref 8–23)
CO2: 24 mmol/L (ref 22–32)
Calcium: 7.9 mg/dL — ABNORMAL LOW (ref 8.9–10.3)
Chloride: 101 mmol/L (ref 98–111)
Creatinine, Ser: 1.27 mg/dL — ABNORMAL HIGH (ref 0.61–1.24)
GFR, Estimated: >60 mL/min (ref >60)
Glucose, Bld: 111 mg/dL — ABNORMAL HIGH (ref 70–99)
Potassium: 4.1 mmol/L (ref 3.5–5.1)
Sodium: 137 mmol/L (ref 135–145)

## 2024-02-18 LAB — CBC
HCT: 35.8 % — ABNORMAL LOW (ref 39.0–52.0)
Hemoglobin: 11.8 g/dL — ABNORMAL LOW (ref 13.0–17.0)
MCH: 29.7 pg (ref 26.0–34.0)
MCHC: 33 g/dL (ref 30.0–36.0)
MCV: 90.2 fL (ref 80.0–100.0)
Platelets: 276 K/uL (ref 150–400)
RBC: 3.97 MIL/uL — ABNORMAL LOW (ref 4.22–5.81)
RDW: 13.2 % (ref 11.5–15.5)
WBC: 7.1 K/uL (ref 4.0–10.5)
nRBC: 0 % (ref 0.0–0.2)

## 2024-02-18 LAB — PHOSPHORUS: Phosphorus: 2.3 mg/dL — ABNORMAL LOW (ref 2.5–4.6)

## 2024-02-18 LAB — MAGNESIUM: Magnesium: 1.7 mg/dL (ref 1.7–2.4)

## 2024-02-18 MED ORDER — K PHOS MONO-SOD PHOS DI & MONO 155-852-130 MG PO TABS
500.0000 mg | ORAL_TABLET | Freq: Four times a day (QID) | ORAL | Status: AC
  Administered 2024-02-18 (×2): 500 mg via ORAL
  Filled 2024-02-18 (×2): qty 2

## 2024-02-18 MED ORDER — ENOXAPARIN SODIUM 40 MG/0.4ML IJ SOSY
40.0000 mg | PREFILLED_SYRINGE | Freq: Every evening | INTRAMUSCULAR | Status: DC
  Administered 2024-02-19 – 2024-02-20 (×2): 40 mg via SUBCUTANEOUS
  Filled 2024-02-18 (×6): qty 0.4

## 2024-02-18 MED ORDER — ADULT MULTIVITAMIN W/MINERALS CH
1.0000 | ORAL_TABLET | Freq: Every day | ORAL | Status: DC
  Administered 2024-02-18 – 2024-02-24 (×7): 1 via ORAL
  Filled 2024-02-18 (×7): qty 1

## 2024-02-18 MED ORDER — TAMSULOSIN HCL 0.4 MG PO CAPS: 0.4000 mg | ORAL_CAPSULE | Freq: Every day | ORAL | Status: DC

## 2024-02-18 MED ORDER — TAMSULOSIN HCL 0.4 MG PO CAPS
0.4000 mg | ORAL_CAPSULE | Freq: Every day | ORAL | Status: DC
  Administered 2024-02-18 – 2024-02-22 (×6): 0.4 mg via ORAL
  Filled 2024-02-18 (×6): qty 1

## 2024-02-18 MED ORDER — ENSURE PLUS HIGH PROTEIN PO LIQD
237.0000 mL | Freq: Three times a day (TID) | ORAL | Status: DC
  Administered 2024-02-18 – 2024-02-24 (×17): 237 mL via ORAL

## 2024-02-18 MED ORDER — LACTULOSE 10 GM/15ML PO SOLN
20.0000 g | Freq: Two times a day (BID) | ORAL | Status: AC
  Administered 2024-02-18 (×2): 20 g via ORAL
  Filled 2024-02-18 (×2): qty 30

## 2024-02-18 NOTE — Progress Notes (Signed)
 Progress Note   Patient: Francisco Woods FMW:[id number] DOB: 08-20-1960 DOA: 02/15/2024     3 DOS: the patient was seen and examined on 02/18/2024   Brief hospital course: From HPI Francisco Woods is a 62 y.o. Caucasian male with medical history significant for osteoarthritis, BPH and tobacco abuse, who presented to the emergency room with acute onset of lower abdominal pain with significant diminished urine output over the last 4 days.  He admitted to nausea without vomiting or diarrhea.  He has been having tactile fever and chills.  He denied any chest pain or palpitations.  No cough or wheezing or dyspnea.  He admits to mild headache and dizziness.  The last time he saw physician was 3 years ago.  He denies any history of hypertension, diabetes mellitus or chronic kidney disease.   After insertion of a Foley catheter in the ED, the patient had more than 1 L of urine output and felt much better.   ED Course: When the patient came to the ER, BP was 169/118 and later 160/94 with otherwise normal vital signs.  Labs revealed mild hyponatremia 134 and hypochloremia of 89 with mild hyperkalemia 5.4, BUN of 102 and creatinine of 9.62 with no previous levels to compare.  Calcium was 8.5 and anion gap 30.  Lactic acid was 2.4 and later 2.6.  CBC was normal except for thrombocytosis of 461. EKG as reviewed by me : EKG showed normal sinus rhythm with rate of 71 with slightly poor R wave progression and biatrial enlargement with left axis deviation and borderline prolonged QT interval with QTc of 500 mL. Imaging: Abdominal pelvic CT scan without contrast revealed the following no acute abnormality in the abdomen or pelvis.   The patient was given 2 L bolus of IV lactated ringer.  He will be admitted to a progressive unit bed for further evaluation and management.    Assessment and Plan:  # AKI (acute kidney injury) - Secondary to urinary retention.  After Foley catheterization 3.6 L of urine  drained Renal function is improving S/p IV hydration.  Continue oral hydration Nephrologist on board and case discussed Monitor renal indicis Avoid nephrotoxic drugs Renally dose all medications sCr 1.27, gradually improving   Hypertensive urgency Blood pressure improved Currently stable without medications. Monitor BP and titrate medication accordingly  # Vitamin B12 level 353, goal >400.  Started vitamin B12 1000 mcg p.o. daily.  Follow-up with PCP to repeat B12 level after 3 to 6 months.  # Hypophosphatemia due to nutritional deficiency.  Phos repleted. Check electrolytes daily and replete as needed.   DVT prophylaxis: SQ heparin.  Advanced Care Planning:  Code Status: full code.  Family Communication: None present at bedside  Disposition Plan: Patient is homeless, [facility] consulted for safe dispo plan for  Consults called: Nephrology. Urology     Subjective:  Patient seen and examined at bedside this morning Patient was sleepy, stated that he just feels tired, no new specific complaints.   Physical Exam:  GENERAL:  63 y.o.-year-old Caucasian male patient lying in the bed with no acute distress.  EYES: Pupils equal, round, reactive to light and accommodation. No scleral icterus. Extraocular muscles intact.  HEENT: Head atraumatic, normocephalic. Oropharynx and nasopharynx clear.  NECK:  Supple, no jugular venous distention. No thyroid enlargement, no tenderness.  LUNGS: Normal breath sounds bilaterally, no wheezing, rales,rhonchi or crepitation. No use of accessory muscles of respiration.  CARDIOVASCULAR: Regular rate and rhythm, S1, S2 normal. No murmurs, rubs,  or gallops.  ABDOMEN: Soft, nondistended, nontender. Bowel sounds present. No organomegaly or mass.  EXTREMITIES: No pedal edema, cyanosis, or clubbing.  NEUROLOGIC: Cranial nerves II through XII are intact. Muscle strength 5/5 in all extremities. Sensation intact. Gait not checked.  PSYCHIATRIC: The patient is  alert and oriented x 3.  Normal affect and good eye contact. SKIN: No obvious rash, lesion, or ulcer.   Vitals:   02/17/24 2359 02/18/24 0336 02/18/24 0812 02/18/24 1152  BP: 117/68 (!) 127/56 125/72 (!) 113/53  Pulse: 86 93 81 76  Resp: 20 20 18    Temp: 97.9 F (36.6 C) 98.4 F (36.9 C) 98.6 F (37 C) 97.6 F (36.4 C)  TempSrc:   Oral Axillary  SpO2: 97% 100% 98% 100%  Weight:      Height:        Data Reviewed:     Latest Ref Rng & Units 02/18/2024    5:47 AM 02/17/2024    4:45 AM 02/16/2024    4:22 AM  CBC  WBC 4.0 - 10.5 K/uL 7.1  6.0  9.2   Hemoglobin 13.0 - 17.0 g/dL 88.1  88.1  86.4   Hematocrit 39.0 - 52.0 % 35.8  34.4  38.9   Platelets 150 - 400 K/uL 276  292  339        Latest Ref Rng & Units 02/18/2024    5:47 AM 02/17/2024    4:45 AM 02/16/2024    4:22 AM  BMP  Glucose 70 - 99 mg/dL 888  898  890   BUN 8 - 23 mg/dL 59  89  888   Creatinine 0.61 - 1.24 mg/dL 8.72  7.84  3.54   Sodium 135 - 145 mmol/L 137  135  135   Potassium 3.5 - 5.1 mmol/L 4.1  3.5  4.2   Chloride 98 - 111 mmol/L 101  104  97   CO2 22 - 32 mmol/L 24  24  19    Calcium 8.9 - 10.3 mg/dL 7.9  7.3  7.6        Time spent: 40 minutes  Author: Elvan Sor, MD 02/18/2024 3:18 PM  For on call review [email or web address].

## 2024-02-18 NOTE — Progress Notes (Signed)
 Initial Nutrition Assessment  DOCUMENTATION CODES:   Underweight, Severe malnutrition in context of social or environmental circumstances  INTERVENTION:   -Liberalize diet to 2 gram sodium for wider variety of meal selections -MVI with minerals daily -Ensure Plus High Protein po TID, each supplement provides 350 kcal and 20 grams of protein   NUTRITION DIAGNOSIS:   Severe Malnutrition related to social / environmental circumstances as evidenced by moderate muscle depletion, severe muscle depletion, moderate fat depletion, severe fat depletion.  GOAL:   Patient will meet greater than or equal to 90% of their needs  MONITOR:   PO intake, Supplement acceptance  REASON FOR ASSESSMENT:   Malnutrition Screening Tool    ASSESSMENT:   Patient with medical history significant for osteoarthritis, BPH and tobacco abuse, who presented with acute onset of lower abdominal pain with significant diminished urine output over the last 4 days.  Patient admitted with AKI and hypertensive urgency.   Reviewed I/O's: -2.6 L x 24 hours and -6.5 L since admission  UOP: 3.6 L x 24 hours  Per nephrology notes, renal function improving.   Francisco Woods was sleeping soundly at time of visit. He did not respond to voice or touch. No family present at time of visit.   He is currently on a heart healthy diet. Noted meal completions 100%.   No recent weight history available to assess at this time. Last prior weight was in 2017. Noted history of distant weight loss.   Medications reviewed and include vitamin B-12, lactulose, and phosphorus.   Labs reviewed: CBGS: 60-102 (inpatient orders for glycemic control are none).    NUTRITION - FOCUSED PHYSICAL EXAM:  Flowsheet Row Most Recent Value  Orbital Region Severe depletion  Upper Arm Region Moderate depletion  Thoracic and Lumbar Region Severe depletion  Buccal Region Moderate depletion  Temple Region Severe depletion  Clavicle Bone Region Severe  depletion  Clavicle and Acromion Bone Region Severe depletion  Scapular Bone Region Severe depletion  Dorsal Hand Moderate depletion  Patellar Region Moderate depletion  Anterior Thigh Region Moderate depletion  Posterior Calf Region Moderate depletion  Edema (RD Assessment) None  Hair Reviewed  Eyes Reviewed  Mouth Reviewed  Skin Reviewed  Nails Reviewed    Diet Order:   Diet Order             Diet 2 gram sodium Fluid consistency: Thin  Diet effective now                   EDUCATION NEEDS:   No education needs have been identified at this time  Skin:  Skin Assessment: Reviewed RN Assessment  Last BM:  02/16/24  Height:   Ht Readings from Last 1 Encounters:  02/16/24 6' (1.829 m)    Weight:   Wt Readings from Last 1 Encounters:  02/16/24 59 kg    Ideal Body Weight:  80.9 kg  BMI:  Body mass index is 17.63 kg/m.  Estimated Nutritional Needs:   Kcal:  7649-7449  Protein:  115-130 grams  Fluid:  2.0-2.2 L    Margery ORN, RD, LDN, CDCES Registered Dietitian III Certified Diabetes Care and Education Specialist If unable to reach this RD, please use RD Inpatient group chat on secure chat between hours of 8am-4 pm daily

## 2024-02-18 NOTE — Progress Notes (Signed)
 Mobility Specialist - Progress Note   02/18/24 0900  Mobility  Activity Ambulated with assistance;Stood at bedside  Level of Assistance Standby assist, set-up cues, supervision of patient - no hands on  Assistive Device None  Distance Ambulated (ft) 8 ft  Range of Motion/Exercises Active  Activity Response Tolerated well  Mobility visit 1 Mobility  Mobility Specialist Start Time (ACUTE ONLY) 0932  Mobility Specialist Stop Time (ACUTE ONLY) [id number]  Mobility Specialist Time Calculation (min) (ACUTE ONLY) 6 min     Pt was supine in bed upon entry. Pt agreed to mobility. Pt is able to get to EOB independently with no AD. Pt is able to STS independently with no AD. Pt ambulated to the bathroom with no AD and well. Pt stated he is tired due to medication. After activity pt returned to bed with needs in reach.  Clem Rodes Mobility Specialist 02/18/24, 9:57 AM

## 2024-02-18 NOTE — Plan of Care (Signed)

## 2024-02-18 NOTE — Progress Notes (Signed)
 [facility]  PROGRESS NOTE   Subjective:   Patient seen laying in bed, fetal position States his stomach, last BM last week Also has some nausea Tolerating small meals.  Room air  Creatinine 1.27  Objective:  Vital signs: Blood pressure (!) 113/53, pulse 76, temperature 97.6 F (36.4 C), temperature source Axillary, resp. rate 18, height 6' (1.829 m), weight 59 kg, SpO2 100%.  Intake/Output Summary (Last 24 hours) at 02/18/2024 1311 Last data filed at 02/18/2024 0500 Gross per 24 hour  Intake 660 ml  Output 3575 ml  Net -2915 ml   Filed Weights   02/15/24 1553 02/16/24 1400  Weight: 57.2 kg 59 kg     Physical Exam: General:  No acute distress  Head:  Normocephalic, atraumatic. Moist oral mucosal membranes  Eyes:  Anicteric  Lungs:   Clear to auscultation, normal effort  Heart:  S1S2 no rubs  Abdomen:   Soft, nontender, bowel sounds present  Extremities:  No peripheral edema.  Neurologic:  Awake, alert, following commands  Skin:  No lesions       Basic Metabolic Panel: Recent Labs  Lab 02/15/24 1755 02/16/24 0422 02/17/24 0445 02/18/24 0547  NA 134* 135 135 137  K 5.4* 4.2 3.5 4.1  CL 89* 97* 104 101  CO2 15* 19* 24 24  GLUCOSE 105* 109* 101* 111*  BUN 102* 111* 89* 59*  CREATININE 9.62* 6.45* 2.15* 1.27*  CALCIUM 8.4* 7.6* 7.3* 7.9*  MG  --   --   --  1.7  PHOS  --   --   --  2.3*   GFR: Estimated Creatinine Clearance: 49.7 mL/min (A) (by C-G formula based on SCr of 1.27 mg/dL (H)).  Liver Function Tests: Recent Labs  Lab 02/15/24 1755  AST 19  ALT 15  ALKPHOS 92  BILITOT 0.8  PROT 9.0*  ALBUMIN 4.0   Recent Labs  Lab 02/15/24 1755  LIPASE 48   No results for input(s): AMMONIA in the last 168 hours.  CBC: Recent Labs  Lab 02/15/24 1606 02/16/24 0422 02/17/24 0445 02/18/24 0547  WBC 10.5 9.2 6.0 7.1  NEUTROABS  --   --  4.0  --   HGB 16.5 13.5 11.8* 11.8*  HCT 48.0 38.9* 34.4* 35.8*  MCV 86.3 85.7 86.9 90.2   PLT 461* 339 292 276     HbA1C: No results found for: HGBA1C  Urinalysis: Recent Labs    02/15/24 1646  COLORURINE YELLOW*  LABSPEC 1.011  PHURINE 5.0  GLUCOSEU NEGATIVE  HGBUR MODERATE*  BILIRUBINUR NEGATIVE  KETONESUR NEGATIVE  PROTEINUR NEGATIVE  NITRITE NEGATIVE  LEUKOCYTESUR NEGATIVE      Imaging: No results found.    Medications:     Chlorhexidine Gluconate Cloth  6 each Topical Daily   vitamin B-12  1,000 mcg Oral Daily   feeding supplement  237 mL Oral TID BM   heparin  5,000 Units Subcutaneous Q8H   lactulose  20 g Oral BID   multivitamin with minerals  1 tablet Oral Daily   tamsulosin  0.4 mg Oral QPC supper   traZODone  50 mg Oral QHS    Assessment/ Plan:     63 y.o. male with a PMHx of hypertension, benign prostate hypertrophy, history of tobacco use, osteoarthritis with history of nonsteroidal anti-inflammatory drug use now sent to the emergency room with acute abdominal pain and decreased urine output for the last 4 days.  He had a CT scan which did not reveal any  evidence of hydronephrosis.  Patient was deemed to have urinary retention and had a Foley catheter placed.  He had about 3 L of urine output since Foley insertion.  He was also given IV fluids with isotonic saline.  He was found to have a creatinine of 9.62 initially with hyperkalemia.  Subsequently the creatinine has improved after Foley placement.     Principal Problem:   AKI (acute kidney injury) Active Problems:   Hypertensive urgency   #1: Acute kidney injury: Baseline unknown. Acute kidney injury is most likely secondary to urinary retention. CT abd/pelvis negative for hydronephrosis.  Patient is status post Foley catheter placement.    Creatinine continues to improve with exceptional urine output. Will continue to monitor.    #2: Hypertension, essential: Blood pressure has improved, 113/53    LOS: 3 [facility] 10/20/20251:11 PM

## 2024-02-19 DIAGNOSIS — N179 Acute kidney failure, unspecified: Secondary | ICD-10-CM | POA: Diagnosis not present

## 2024-02-19 LAB — BASIC METABOLIC PANEL WITH GFR
Anion gap: 8 (ref 5–15)
BUN: 33 mg/dL — ABNORMAL HIGH (ref 8–23)
CO2: 26 mmol/L (ref 22–32)
Calcium: 7.8 mg/dL — ABNORMAL LOW (ref 8.9–10.3)
Chloride: 103 mmol/L (ref 98–111)
Creatinine, Ser: 0.92 mg/dL (ref 0.61–1.24)
GFR, Estimated: >60 mL/min (ref >60)
Glucose, Bld: 113 mg/dL — ABNORMAL HIGH (ref 70–99)
Potassium: 4 mmol/L (ref 3.5–5.1)
Sodium: 137 mmol/L (ref 135–145)

## 2024-02-19 LAB — CBC
HCT: 34.1 % — ABNORMAL LOW (ref 39.0–52.0)
Hemoglobin: 11.1 g/dL — ABNORMAL LOW (ref 13.0–17.0)
MCH: 29.6 pg (ref 26.0–34.0)
MCHC: 32.6 g/dL (ref 30.0–36.0)
MCV: 90.9 fL (ref 80.0–100.0)
Platelets: 261 K/uL (ref 150–400)
RBC: 3.75 MIL/uL — ABNORMAL LOW (ref 4.22–5.81)
RDW: 13.2 % (ref 11.5–15.5)
WBC: 7.2 K/uL (ref 4.0–10.5)
nRBC: 0 % (ref 0.0–0.2)

## 2024-02-19 LAB — URIC ACID: Uric Acid, Serum: 5.8 mg/dL (ref 3.7–8.6)

## 2024-02-19 LAB — PHOSPHORUS: Phosphorus: 2.3 mg/dL — ABNORMAL LOW (ref 2.5–4.6)

## 2024-02-19 LAB — MAGNESIUM: Magnesium: 1.4 mg/dL — ABNORMAL LOW (ref 1.7–2.4)

## 2024-02-19 MED ORDER — COLCHICINE 0.3 MG HALF TABLET
0.3000 mg | ORAL_TABLET | Freq: Every day | ORAL | Status: DC
  Administered 2024-02-20 – 2024-02-23 (×4): 0.3 mg via ORAL
  Filled 2024-02-19 (×5): qty 1

## 2024-02-19 MED ORDER — PANTOPRAZOLE SODIUM 40 MG PO TBEC
40.0000 mg | DELAYED_RELEASE_TABLET | Freq: Every day | ORAL | Status: DC
  Administered 2024-02-19 – 2024-02-24 (×6): 40 mg via ORAL
  Filled 2024-02-19 (×6): qty 1

## 2024-02-19 MED ORDER — MAGNESIUM SULFATE 2 GM/50ML IV SOLN: 2.0000 g | Freq: Once | INTRAVENOUS | Status: DC

## 2024-02-19 MED ORDER — COLCHICINE 0.6 MG PO TABS
0.6000 mg | ORAL_TABLET | Freq: Once | ORAL | Status: AC
  Administered 2024-02-19: 0.6 mg via ORAL
  Filled 2024-02-19: qty 1

## 2024-02-19 MED ORDER — POLYETHYLENE GLYCOL 3350 17 G PO PACK
17.0000 g | PACK | Freq: Two times a day (BID) | ORAL | Status: DC
  Administered 2024-02-19 – 2024-02-21 (×5): 17 g via ORAL
  Filled 2024-02-19 (×5): qty 1

## 2024-02-19 MED ORDER — PREDNISONE 20 MG PO TABS
40.0000 mg | ORAL_TABLET | Freq: Every day | ORAL | Status: AC
  Administered 2024-02-19 – 2024-02-21 (×3): 40 mg via ORAL
  Filled 2024-02-19 (×3): qty 2

## 2024-02-19 MED ORDER — K PHOS MONO-SOD PHOS DI & MONO 155-852-130 MG PO TABS
500.0000 mg | ORAL_TABLET | Freq: Four times a day (QID) | ORAL | Status: AC
  Administered 2024-02-19 (×3): 500 mg via ORAL
  Filled 2024-02-19 (×5): qty 2

## 2024-02-19 MED ORDER — BISACODYL 10 MG RE SUPP: 10.0000 mg | Freq: Every day | RECTAL | Status: DC | PRN

## 2024-02-19 MED ORDER — BISACODYL 5 MG PO TBEC
10.0000 mg | DELAYED_RELEASE_TABLET | Freq: Once | ORAL | Status: AC
  Administered 2024-02-19: 10 mg via ORAL
  Filled 2024-02-19: qty 2

## 2024-02-19 MED ORDER — BISACODYL 5 MG PO TBEC
10.0000 mg | DELAYED_RELEASE_TABLET | Freq: Every day | ORAL | Status: DC
  Administered 2024-02-19 – 2024-02-22 (×4): 10 mg via ORAL
  Filled 2024-02-19 (×4): qty 2

## 2024-02-19 MED ORDER — MAGNESIUM OXIDE -MG SUPPLEMENT 400 (240 MG) MG PO TABS
400.0000 mg | ORAL_TABLET | Freq: Two times a day (BID) | ORAL | Status: AC
  Administered 2024-02-19 – 2024-02-21 (×6): 400 mg via ORAL
  Filled 2024-02-19 (×6): qty 1

## 2024-02-19 NOTE — Plan of Care (Signed)

## 2024-02-19 NOTE — Plan of Care (Signed)

## 2024-02-19 NOTE — TOC CM/SW Note (Signed)
 Transition of Care [facility]) - Inpatient Brief Assessment   Patient Details  Name: Francisco Woods MRN: [id number] Date of Birth: [birth year]  Transition of Care [facility]) CM/SW Contact:    Lauraine JAYSON Carpen, LCSW Phone Number: 02/19/2024, 9:04 AM   Clinical Narrative: CSW reviewed chart. Patient does not have a PCP. Documented on AVS for patient to follow up with his Medicaid worker to request to be assigned to a provider. SDOH flags for housing. Resources added to AVS. No other TOC needs identified at this time. CSW will continue to follow progress. Please place [facility] consult if any needs arise.  Transition of Care Asessment: Insurance and Status: Insurance coverage has been reviewed Patient has primary care physician: No Home environment has been reviewed: Home Prior level of function:: Not documented Prior/Current Home Services: No current home services Social Drivers of Health Review: SDOH reviewed interventions complete Readmission risk has been reviewed: Yes Transition of care needs: no transition of care needs at this time

## 2024-02-19 NOTE — Progress Notes (Signed)
 [facility]  PROGRESS NOTE   Subjective:   Patient seen sitting up in bed States he feels better than yesterday Was able to have small BM yesterday Tolerating meals without nausea or vomiting  Creatinine 0.9  Objective:  Vital signs: Blood pressure (!) 141/71, pulse 65, temperature 97.6 F (36.4 C), resp. rate 17, height 6' (1.829 m), weight 59 kg, SpO2 100%.  Intake/Output Summary (Last 24 hours) at 02/19/2024 1123 Last data filed at 02/19/2024 0515 Gross per 24 hour  Intake --  Output 3150 ml  Net -3150 ml   Filed Weights   02/15/24 1553 02/16/24 1400  Weight: 57.2 kg 59 kg     Physical Exam: General:  No acute distress  Head:  Normocephalic, atraumatic. Moist oral mucosal membranes  Eyes:  Anicteric  Lungs:   Clear to auscultation, normal effort  Heart:  S1S2 no rubs  Abdomen:   Soft, nontender, bowel sounds present  Extremities:  No peripheral edema.  Neurologic:  Awake, alert, following commands  Skin:  No lesions       Basic Metabolic Panel: Recent Labs  Lab 02/15/24 1755 02/16/24 0422 02/17/24 0445 02/18/24 0547 02/19/24 0544  NA 134* 135 135 137 137  K 5.4* 4.2 3.5 4.1 4.0  CL 89* 97* 104 101 103  CO2 15* 19* 24 24 26   GLUCOSE 105* 109* 101* 111* 113*  BUN 102* 111* 89* 59* 33*  CREATININE 9.62* 6.45* 2.15* 1.27* 0.92  CALCIUM 8.4* 7.6* 7.3* 7.9* 7.8*  MG  --   --   --  1.7 1.4*  PHOS  --   --   --  2.3* 2.3*   GFR: Estimated Creatinine Clearance: 68.6 mL/min (by C-G formula based on SCr of 0.92 mg/dL).  Liver Function Tests: Recent Labs  Lab 02/15/24 1755  AST 19  ALT 15  ALKPHOS 92  BILITOT 0.8  PROT 9.0*  ALBUMIN 4.0   Recent Labs  Lab 02/15/24 1755  LIPASE 48   No results for input(s): AMMONIA in the last 168 hours.  CBC: Recent Labs  Lab 02/15/24 1606 02/16/24 0422 02/17/24 0445 02/18/24 0547 02/19/24 0544  WBC 10.5 9.2 6.0 7.1 7.2  NEUTROABS  --   --  4.0  --   --   HGB 16.5 13.5 11.8* 11.8*  11.1*  HCT 48.0 38.9* 34.4* 35.8* 34.1*  MCV 86.3 85.7 86.9 90.2 90.9  PLT 461* 339 292 276 261     HbA1C: No results found for: HGBA1C  Urinalysis: No results for input(s): COLORURINE, LABSPEC, PHURINE, GLUCOSEU, HGBUR, BILIRUBINUR, KETONESUR, PROTEINUR, UROBILINOGEN, NITRITE, LEUKOCYTESUR in the last 72 hours.  Invalid input(s): APPERANCEUR     Imaging: No results found.    Medications:     bisacodyl  10 mg Oral QHS   Chlorhexidine Gluconate Cloth  6 each Topical Daily   vitamin B-12  1,000 mcg Oral Daily   enoxaparin (LOVENOX) injection  40 mg Subcutaneous QPM   feeding supplement  237 mL Oral TID BM   magnesium oxide  400 mg Oral BID   multivitamin with minerals  1 tablet Oral Daily   phosphorus  500 mg Oral QID   polyethylene glycol  17 g Oral BID   tamsulosin  0.4 mg Oral QPC supper   traZODone  50 mg Oral QHS    Assessment/ Plan:     63 y.o. male with a PMHx of hypertension, benign prostate hypertrophy, history of tobacco use, osteoarthritis with history of nonsteroidal anti-inflammatory drug  use now sent to the emergency room with acute abdominal pain and decreased urine output for the last 4 days.  He had a CT scan which did not reveal any evidence of hydronephrosis.  Patient was deemed to have urinary retention and had a Foley catheter placed.  He had about 3 L of urine output since Foley insertion.  He was also given IV fluids with isotonic saline.  He was found to have a creatinine of 9.62 initially with hyperkalemia.  Subsequently the creatinine has improved after Foley placement.     Principal Problem:   AKI (acute kidney injury) Active Problems:   Hypertensive urgency   #1: Acute kidney injury: Baseline unknown. Acute kidney injury is most likely secondary to urinary retention. CT abd/pelvis negative for hydronephrosis.  Patient is status post Foley catheter placement.    Renal function has returned to normal.  Patient  encouraged to maintain oral intake and hydration.   #2: Hypertension, essential: Blood pressure 141/71 today.  Due to renal recovery, we will sign off at this time.     LOS: 4 [facility] 10/21/202511:23 AM

## 2024-02-19 NOTE — Progress Notes (Signed)
 Patient is alert and oriented x4. Transfer orders in for room 107. Pt currently eating breakfast. States trash on floor was there when he woke up. Educated patient not to throw trash on the floor. Pt refusing lactulose, educated MD and PO miralax and dulxolax ordered. Given to patient. Pt educated on plan to transfer to room 107, tele removed per order. Report called to Merlynn PEAK and Donnice PEAK. Pt has foley in place for retention. Pt able to be tx via WC. Pt finishing breakfast then to be transferred. NAD, VSS. Bed locked and low, call bell in reach.

## 2024-02-19 NOTE — Progress Notes (Signed)
 Progress Note   Patient: Francisco Woods FMW:[id number] DOB: [birth year] DOA: 02/15/2024     4 DOS: the patient was seen and examined on 02/19/2024   Brief hospital course: From HPI Francisco Woods is a 63 y.o. Caucasian male with medical history significant for osteoarthritis, BPH and tobacco abuse, who presented to the emergency room with acute onset of lower abdominal pain with significant diminished urine output over the last 4 days.  He admitted to nausea without vomiting or diarrhea.  He has been having tactile fever and chills.  He denied any chest pain or palpitations.  No cough or wheezing or dyspnea.  He admits to mild headache and dizziness.  The last time he saw physician was 3 years ago.  He denies any history of hypertension, diabetes mellitus or chronic kidney disease.   After insertion of a Foley catheter in the ED, the patient had more than 1 L of urine output and felt much better.   ED Course: When the patient came to the ER, BP was 169/118 and later 160/94 with otherwise normal vital signs.  Labs revealed mild hyponatremia 134 and hypochloremia of 89 with mild hyperkalemia 5.4, BUN of 102 and creatinine of 9.62 with no previous levels to compare.  Calcium was 8.5 and anion gap 30.  Lactic acid was 2.4 and later 2.6.  CBC was normal except for thrombocytosis of 461. EKG as reviewed by me : EKG showed normal sinus rhythm with rate of 71 with slightly poor R wave progression and biatrial enlargement with left axis deviation and borderline prolonged QT interval with QTc of 500 mL. Imaging: Abdominal pelvic CT scan without contrast revealed the following no acute abnormality in the abdomen or pelvis.   The patient was given 2 L bolus of IV lactated ringer.  He will be admitted to a progressive unit bed for further evaluation and management.    Assessment and Plan:  # AKI (acute kidney injury) - Secondary to urinary retention.  After Foley catheterization 3.6 L of urine  drained Renal function is improving S/p IV hydration.  Continue oral hydration Nephrologist on board and case discussed Monitor renal indicis Avoid nephrotoxic drugs Renally dose all medications sCr 0.92, gradually improved. Aki resolved   # Hypertensive urgency Blood pressure improved Currently stable without medications. Monitor BP and titrate medication accordingly  # Vitamin B12 level 353, goal >400.  Started vitamin B12 1000 mcg p.o. daily.  Follow-up with PCP to repeat B12 level after 3 to 6 months.  # Hypophosphatemia due to nutritional deficiency.  Phos repleted. # Hypomagnesemia, mag repleted. Check electrolytes daily and replete as needed.  # Right foot pain possible gout Uric acid 5.8 WNL Patient stated that he has a history of gout arthritis. Colchicine 0.6 mg, 1 dose followed by 0.3 mg p.o. daily ordered Started prednisone  40 mg p.o. daily for 3 days Continue as needed pain meds    DVT prophylaxis: Lovenox  Advanced Care Planning:  Code Status: full code.  Family Communication: None present at bedside  Disposition Plan: Patient is homeless, [facility] consulted for safe dispo plan for  Consults called: Nephrology. Urology     Subjective:  No acute events overnight. In the morning the patient was complaining of pain in the right foot.  Patient stated that he has history of gouty arthritis and pain happens at random joints. Patient denied any other complaints.   Physical Exam: General: NAD, lying comfortably Appear in no distress, affect appropriate Eyes: PERRLA ENT:  Oral Mucosa Clear, moist  Neck: no JVD,  Cardiovascular: S1 and S2 Present, no Murmur,  Respiratory: good respiratory effort, Bilateral Air entry equal and Decreased, no Crackles, no wheezes Abdomen: Bowel Sound present, Soft and no tenderness,  Skin: no rashes Extremities: Right foot mild erythema and tenderness, no calf tenderness Neurologic: without any new focal findings Gait not  checked due to patient safety concerns   Vitals:   02/18/24 2010 02/19/24 0016 02/19/24 0402 02/19/24 0911  BP: 130/61 125/64 (!) 146/74 (!) 141/71  Pulse: 91 92 90 65  Resp: 17 17 16 17   Temp: 98.1 F (36.7 C) 97.9 F (36.6 C) 97.9 F (36.6 C) 97.6 F (36.4 C)  TempSrc:      SpO2: 98% 99% 99% 100%  Weight:      Height:        Data Reviewed:     Latest Ref Rng & Units 02/19/2024    5:44 AM 02/18/2024    5:47 AM 02/17/2024    4:45 AM  CBC  WBC 4.0 - 10.5 K/uL 7.2  7.1  6.0   Hemoglobin 13.0 - 17.0 g/dL 88.8  88.1  88.1   Hematocrit 39.0 - 52.0 % 34.1  35.8  34.4   Platelets 150 - 400 K/uL 261  276  292        Latest Ref Rng & Units 02/19/2024    5:44 AM 02/18/2024    5:47 AM 02/17/2024    4:45 AM  BMP  Glucose 70 - 99 mg/dL 886  888  898   BUN 8 - 23 mg/dL 33  59  89   Creatinine 0.61 - 1.24 mg/dL 9.07  8.72  7.84   Sodium 135 - 145 mmol/L 137  137  135   Potassium 3.5 - 5.1 mmol/L 4.0  4.1  3.5   Chloride 98 - 111 mmol/L 103  101  104   CO2 22 - 32 mmol/L 26  24  24    Calcium 8.9 - 10.3 mg/dL 7.8  7.9  7.3        Time spent: 55 minutes  Author: Elvan Sor, MD 02/19/2024 3:29 PM  For on call review [email or web address].

## 2024-02-19 NOTE — Discharge Instructions (Addendum)
 Reach out to your Medicaid worker to request to be set up with a primary care provider.  Rent/Utility/Housing  Agency Name: [facility] Agency Address: [address] [place], [place] [address] Phone: [phone] Email: [email or web address] Website: [email or web address] Service(s) Offered: Housing services, self-sufficiency, congregate meal program, weatherization program, Field seismologist program, emergency food assistance,  housing counseling, home ownership program, wheels -towork program.  Agency Name: [facility] Address: [address] [place], [place] [address] Phone: [phone] (8a-4p) [phone] (8p- 10p) Email: [email or web address] Website: [email or web address] Service(s) Offered: A program for homeless and/or needy men that includes one-on-one counseling, life skills training and job rehabilitation.  Agency Name: [facility] Address: [address] [place], [place] [address] Phone: [phone] Website: www.[email or web address] Service(s) Offered: Assistance to needy in emergency with utility bills, heating fuel, and prescriptions. Shelter for homeless 7pm-7am. August 24, 2016 15  Agency Name: [facility] [place] (Developmentally Disabled) Address: [address], [place], [place] [address] Phone: [phone] Contact Person: Lemond Cart Email: [email or web address] Website: [email or web address] Service(s) Offered: Helps individuals with developmental disabilities move from housing that is more restrictive to homes where they  can achieve greater independence and have more  opportunities.  Agency Name: [facility] Address: [address], [place], [place] [address] Phone: [phone] Email: [email or web address] Website: [email or web address].org Service(s) Offered: Provides affordable housing for low-income families, elderly, and disabled  individuals. Offer a wide range of  programs and services, from financial planning to afterschool and summer programs.  Agency Name: [facility] [employer] [facility] Address: [address] [place], [place] [address] Phone: [phone] Service(s) Offered: Child support services; child welfare services; food stamps; Medicaid; work first family assistance; and aid with fuel,  rent, food and medicine.  Agency Name: [facility] [place] [employer] Address: [facility] [address], [place], [place]  [address] Phone: [phone] Website: www.familyabuseservices.org Service(s) Offered: 24 hour Crisis Line: [phone]; 24 hour Emergency Shelter; Transitional Housing; Support Groups; Scientist, physiological; [employer]; Hispanic Outreach: [phone];  Visitation Center: [phone].  Agency Name: [facility] [place]. Address: [address] [place], [place] [address] Phone: [phone] Service(s) Offered: CAP Services; Home and [employer]; Individual or Group Supports; Respite Care Non-Institutional Nursing;  Residential Supports; Respite Care and Personal Care Services; Transportation; Family and Friends Night; Recreational Activities; Three Nutritious Meals/Snacks; Consultation with Registered Dietician; Twenty-four hour Registered Nurse Access; Daily and [employer]; Camp [employer] Leaves; [place] for the [employer] (During Summer Months) Bingo Night (Every  Wednesday Night); Special Populations Dance Night  (Every Tuesday Night); Professional Hair Care Services.  Agency Name: [facility] Address: [address], [place], [place] [address] Phone: [phone] Contact Person: Meade High Website: [email or web address].Physicist, medical) Offered: Residential treatment facility for women; food and  clothing, educational & employment development and  transportation to work; Counsellor of  financial skills;  parenting and family reunification; emotional and spiritual  support; transitional housing for program graduates.  Agency Name: [facility] Address: [address] [place], [place] [address] Phone: [phone] Email: [email or web address] Website: [email or web address].es Service(s) Offered: Public housing units for elderly, disabled, and low income people; housing choice vouchers for income eligible  applicants; shelter plus care vouchers; and Psychologist, clinical.  Agency Name: [facility] [employer] Address: [address], [place], [place] [address] Phone: [phone] Email: [email or web address]@[email or web address] Website: www.habitatalamance.org Service(s) Offered: Build houses for families in need of decent housing. Each adult in the family must invest 200  hours of labor on  someone else's house, work with volunteers to build their own house, attend classes on budgeting, home maintenance, yard care, and attend homeowner association meetings.  Agency Name: [place] [facility] Address: [address] [place], [place] [address] Phone: [phone] Website: www.rsli.org Service(s) Offered: Intermediate care facilities for intellectually delayed, Supervised Living in group homes for adults with developmental disabilities, Supervised Living for people who have dual diagnoses (MRMI), Independent Living, Supported Living, respite and a variety of CAP services, pre-vocational services, day supports, and [employer].  Agency Name: [place] [employer] Phone: [phone] Website: www.NCForeclosurePrevention.gov Service(s) Offered: Zero-interest, deferred loans to homeowners struggling to pay their mortgage. Call for more information.

## 2024-02-20 DIAGNOSIS — N179 Acute kidney failure, unspecified: Secondary | ICD-10-CM | POA: Diagnosis not present

## 2024-02-20 LAB — CBC
HCT: 33.6 % — ABNORMAL LOW (ref 39.0–52.0)
Hemoglobin: 11.1 g/dL — ABNORMAL LOW (ref 13.0–17.0)
MCH: 29.8 pg (ref 26.0–34.0)
MCHC: 33 g/dL (ref 30.0–36.0)
MCV: 90.3 fL (ref 80.0–100.0)
Platelets: 299 K/uL (ref 150–400)
RBC: 3.72 MIL/uL — ABNORMAL LOW (ref 4.22–5.81)
RDW: 12.7 % (ref 11.5–15.5)
WBC: 12.1 K/uL — ABNORMAL HIGH (ref 4.0–10.5)
nRBC: 0 % (ref 0.0–0.2)

## 2024-02-20 LAB — BASIC METABOLIC PANEL WITH GFR
Anion gap: 7 (ref 5–15)
BUN: 27 mg/dL — ABNORMAL HIGH (ref 8–23)
CO2: 27 mmol/L (ref 22–32)
Calcium: 8.2 mg/dL — ABNORMAL LOW (ref 8.9–10.3)
Chloride: 100 mmol/L (ref 98–111)
Creatinine, Ser: 0.96 mg/dL (ref 0.61–1.24)
GFR, Estimated: >60 mL/min (ref >60)
Glucose, Bld: 191 mg/dL — ABNORMAL HIGH (ref 70–99)
Potassium: 4.6 mmol/L (ref 3.5–5.1)
Sodium: 134 mmol/L — ABNORMAL LOW (ref 135–145)

## 2024-02-20 LAB — MAGNESIUM: Magnesium: 1.7 mg/dL (ref 1.7–2.4)

## 2024-02-20 LAB — PHOSPHORUS: Phosphorus: 2.4 mg/dL — ABNORMAL LOW (ref 2.5–4.6)

## 2024-02-20 MED ORDER — KETOROLAC TROMETHAMINE 15 MG/ML IJ SOLN
15.0000 mg | Freq: Once | INTRAMUSCULAR | Status: AC
  Administered 2024-02-20: 15 mg via INTRAVENOUS
  Filled 2024-02-20: qty 1

## 2024-02-20 MED ORDER — HYDRALAZINE HCL 20 MG/ML IJ SOLN: 10.0000 mg | Freq: Four times a day (QID) | INTRAMUSCULAR | Status: DC | PRN

## 2024-02-20 MED ORDER — HYDRALAZINE HCL 50 MG PO TABS: 50.0000 mg | ORAL_TABLET | Freq: Four times a day (QID) | ORAL | Status: DC | PRN

## 2024-02-20 MED ORDER — POTASSIUM & SODIUM PHOSPHATES 280-160-250 MG PO PACK
2.0000 | PACK | Freq: Three times a day (TID) | ORAL | Status: AC
  Administered 2024-02-20: 2 via ORAL
  Filled 2024-02-20 (×3): qty 2

## 2024-02-20 MED ORDER — MAGNESIUM SULFATE 2 GM/50ML IV SOLN
2.0000 g | Freq: Once | INTRAVENOUS | Status: AC
  Administered 2024-02-20: 2 g via INTRAVENOUS
  Filled 2024-02-20: qty 50

## 2024-02-20 NOTE — Plan of Care (Signed)
  Problem: Education: Goal: Knowledge of General Education information will improve Description: Including pain rating scale, medication(s)/side effects and non-pharmacologic comfort measures Outcome: Progressing   Problem: Health Behavior/Discharge Planning: Goal: Ability to manage health-related needs will improve Outcome: Progressing   Problem: Clinical Measurements: Goal: Ability to maintain clinical measurements within normal limits will improve Outcome: Progressing Goal: Will remain free from infection Outcome: Progressing Goal: Diagnostic test results will improve Outcome: Progressing Goal: Respiratory complications will improve Outcome: Progressing Goal: Cardiovascular complication will be avoided Outcome: Progressing   Problem: Coping: Goal: Level of anxiety will decrease Outcome: Progressing   Problem: Safety: Goal: Ability to remain free from injury will improve Outcome: Progressing   Problem: Skin Integrity: Goal: Risk for impaired skin integrity will decrease Outcome: Progressing   

## 2024-02-20 NOTE — Progress Notes (Signed)
 Mobility Specialist - Progress Note   02/20/24 1300  Mobility  Activity Ambulated with assistance;Stood at bedside;Respositioned in chair  Level of Assistance Independent after set-up  Assistive Device Front wheel walker  Distance Ambulated (ft) 3 ft  Range of Motion/Exercises Active Assistive  Activity Response Tolerated fair  Mobility visit 1 Mobility  Mobility Specialist Start Time (ACUTE ONLY) 1121  Mobility Specialist Stop Time (ACUTE ONLY) 1211  Mobility Specialist Time Calculation (min) (ACUTE ONLY) 50 min     Pt was getting cleaned by nurse upon entry. Pt agreed to mobility. Pt stated that his right foot is given him pain. Pt is overcompensating due to right foot pain. Pt was able to get to the EOB with bed features and no AD. Pt is able to STS(3X) with 2WW and no CGA. Pt did experience some discomfort when STS motion. Pt was able to ambulate towards the recliner with a lateral pivot. Pt repositioned in the recliner with needs in reach. Lunch services entered the room upon exit.  Clem Rodes Mobility Specialist 02/20/24, 1:10 PM

## 2024-02-20 NOTE — Progress Notes (Signed)
 Progress Note   Patient: Francisco Woods FMW:[id number] DOB: [birth year] DOA: 02/15/2024     5 DOS: the patient was seen and examined on 02/20/2024   Brief hospital course: From HPI TIMOUTHY GILARDI is a 63 y.o. Caucasian male with medical history significant for osteoarthritis, BPH and tobacco abuse, who presented to the emergency room with acute onset of lower abdominal pain with significant diminished urine output over the last 4 days.  He admitted to nausea without vomiting or diarrhea.  He has been having tactile fever and chills.  He denied any chest pain or palpitations.  No cough or wheezing or dyspnea.  He admits to mild headache and dizziness.  The last time he saw physician was 3 years ago.  He denies any history of hypertension, diabetes mellitus or chronic kidney disease.   After insertion of a Foley catheter in the ED, the patient had more than 1 L of urine output and felt much better.   ED Course: When the patient came to the ER, BP was 169/118 and later 160/94 with otherwise normal vital signs.  Labs revealed mild hyponatremia 134 and hypochloremia of 89 with mild hyperkalemia 5.4, BUN of 102 and creatinine of 9.62 with no previous levels to compare.  Calcium was 8.5 and anion gap 30.  Lactic acid was 2.4 and later 2.6.  CBC was normal except for thrombocytosis of 461. EKG as reviewed by me : EKG showed normal sinus rhythm with rate of 71 with slightly poor R wave progression and biatrial enlargement with left axis deviation and borderline prolonged QT interval with QTc of 500 mL. Imaging: Abdominal pelvic CT scan without contrast revealed the following no acute abnormality in the abdomen or pelvis.   The patient was given 2 L bolus of IV lactated ringer.  He will be admitted to a progressive unit bed for further evaluation and management.    Assessment and Plan:  # AKI (acute kidney injury): Resolved - Secondary to urinary retention.  After Foley catheterization 3.6 L of urine  drained Renal function is improving S/p IV hydration.  Continue oral hydration Nephrologist on board and case discussed Monitor renal indicis Avoid nephrotoxic drugs Renally dose all medications sCr 0.92, gradually improved. Aki resolved   # Hypertensive urgency Blood pressure improved Currently stable without medications. 10/22 BP fluctuating, use hydralazine p.o. vs IV according to BP parameters Monitor BP and titrate medication accordingly  # Vitamin B12 level 353, goal >400.  Started vitamin B12 1000 mcg p.o. daily.  Follow-up with PCP to repeat B12 level after 3 to 6 months.  # Hypophosphatemia due to nutritional deficiency.  Phos repleted. # Hypomagnesemia, mag repleted. Check electrolytes daily and replete as needed.  # Right foot pain possible pseudogout Uric acid 5.8 WNL Patient stated that he has a history of gout arthritis. Colchicine 0.6 mg, 1 dose followed by 0.3 mg p.o. daily ordered Started prednisone  40 mg p.o. daily for 3 days 10/22 Toradol 15 mg x 1 dose given Continue as needed pain meds    DVT prophylaxis: Lovenox  Advanced Care Planning:  Code Status: full code.  Family Communication: None present at bedside  Disposition Plan: Patient is homeless, [facility] consulted for safe dispo plan for  Consults called: Nephrology. Urology     Subjective:  No acute events overnight. Right foot pain is better, he still has pain 6/10, Patient denied any other complaints.    Physical Exam: General: NAD, lying comfortably Appear in no distress, affect appropriate  Eyes: PERRLA ENT: Oral Mucosa Clear, moist  Neck: no JVD,  Cardiovascular: S1 and S2 Present, no Murmur,  Respiratory: good respiratory effort, Bilateral Air entry equal and Decreased, no Crackles, no wheezes Abdomen: Bowel Sound present, Soft and no tenderness,  Skin: no rashes Extremities: Right foot mild erythema and tenderness, no calf tenderness Neurologic: without any new focal  findings Gait not checked due to patient safety concerns   Vitals:   02/19/24 1950 02/20/24 0410 02/20/24 0816 02/20/24 1625  BP: 135/74 125/76 (!) 149/83 (!) 140/78  Pulse: 88 85 80 84  Resp: 20 16 16 18   Temp: 98.4 F (36.9 C) 97.6 F (36.4 C) 97.8 F (36.6 C) 98 F (36.7 C)  TempSrc: Oral Oral Oral   SpO2: 98% 99% 99% 97%  Weight:      Height:        Data Reviewed:     Latest Ref Rng & Units 02/20/2024    4:26 AM 02/19/2024    5:44 AM 02/18/2024    5:47 AM  CBC  WBC 4.0 - 10.5 K/uL 12.1  7.2  7.1   Hemoglobin 13.0 - 17.0 g/dL 88.8  88.8  88.1   Hematocrit 39.0 - 52.0 % 33.6  34.1  35.8   Platelets 150 - 400 K/uL 299  261  276        Latest Ref Rng & Units 02/20/2024    4:26 AM 02/19/2024    5:44 AM 02/18/2024    5:47 AM  BMP  Glucose 70 - 99 mg/dL 808  886  888   BUN 8 - 23 mg/dL 27  33  59   Creatinine 0.61 - 1.24 mg/dL 9.03  9.07  8.72   Sodium 135 - 145 mmol/L 134  137  137   Potassium 3.5 - 5.1 mmol/L 4.6  4.0  4.1   Chloride 98 - 111 mmol/L 100  103  101   CO2 22 - 32 mmol/L 27  26  24    Calcium 8.9 - 10.3 mg/dL 8.2  7.8  7.9        Time spent: 40 minutes  Author: Elvan Sor, MD 02/20/2024 5:27 PM  For on call review [email or web address].

## 2024-02-20 NOTE — Plan of Care (Signed)

## 2024-02-21 ENCOUNTER — Inpatient Hospital Stay

## 2024-02-21 DIAGNOSIS — N179 Acute kidney failure, unspecified: Secondary | ICD-10-CM | POA: Diagnosis not present

## 2024-02-21 LAB — CBC
HCT: 34.9 % — ABNORMAL LOW (ref 39.0–52.0)
Hemoglobin: 11.2 g/dL — ABNORMAL LOW (ref 13.0–17.0)
MCH: 30 pg (ref 26.0–34.0)
MCHC: 32.1 g/dL (ref 30.0–36.0)
MCV: 93.6 fL (ref 80.0–100.0)
Platelets: 325 K/uL (ref 150–400)
RBC: 3.73 MIL/uL — ABNORMAL LOW (ref 4.22–5.81)
RDW: 13 % (ref 11.5–15.5)
WBC: 13.6 K/uL — ABNORMAL HIGH (ref 4.0–10.5)
nRBC: 0 % (ref 0.0–0.2)

## 2024-02-21 LAB — PHOSPHORUS: Phosphorus: 2.8 mg/dL (ref 2.5–4.6)

## 2024-02-21 LAB — BASIC METABOLIC PANEL WITH GFR
Anion gap: 8 (ref 5–15)
BUN: 33 mg/dL — ABNORMAL HIGH (ref 8–23)
CO2: 27 mmol/L (ref 22–32)
Calcium: 8.4 mg/dL — ABNORMAL LOW (ref 8.9–10.3)
Chloride: 103 mmol/L (ref 98–111)
Creatinine, Ser: 1.19 mg/dL (ref 0.61–1.24)
GFR, Estimated: >60 mL/min (ref >60)
Glucose, Bld: 150 mg/dL — ABNORMAL HIGH (ref 70–99)
Potassium: 4.9 mmol/L (ref 3.5–5.1)
Sodium: 138 mmol/L (ref 135–145)

## 2024-02-21 LAB — MAGNESIUM: Magnesium: 2 mg/dL (ref 1.7–2.4)

## 2024-02-21 MED ORDER — POLYETHYLENE GLYCOL 3350 17 GM/SCOOP PO POWD
119.0000 g | Freq: Once | ORAL | Status: AC
  Administered 2024-02-21: 119 g via ORAL
  Filled 2024-02-21: qty 119

## 2024-02-21 MED ORDER — PREDNISONE 20 MG PO TABS
40.0000 mg | ORAL_TABLET | Freq: Every day | ORAL | Status: AC
  Administered 2024-02-22 – 2024-02-23 (×2): 40 mg via ORAL
  Filled 2024-02-21 (×2): qty 2

## 2024-02-21 MED ORDER — POLYETHYLENE GLYCOL 3350 17 G PO PACK
17.0000 g | PACK | Freq: Two times a day (BID) | ORAL | Status: DC
  Administered 2024-02-22 – 2024-02-23 (×2): 17 g via ORAL
  Filled 2024-02-21 (×3): qty 1

## 2024-02-21 MED ORDER — ALPRAZOLAM 0.25 MG PO TABS
0.2500 mg | ORAL_TABLET | Freq: Two times a day (BID) | ORAL | Status: DC | PRN
  Administered 2024-02-21: 0.25 mg via ORAL
  Filled 2024-02-21: qty 1

## 2024-02-21 NOTE — Plan of Care (Signed)

## 2024-02-21 NOTE — Progress Notes (Signed)
 Pt refused to take out foley at this time. Pt stated is painful and wants it to be removed after lunch.

## 2024-02-21 NOTE — Progress Notes (Signed)
 Gave pt laxative trtmt and pt refused to get foley removed at this time.   Patient also refused bed alarm and said he is getting up when he needs to use the bathroom.

## 2024-02-21 NOTE — Progress Notes (Signed)
 Progress Note   Patient: Francisco Woods FMW:[id number] DOB: [birth year] DOA: 02/15/2024     6 DOS: the patient was seen and examined on 02/21/2024   Brief hospital course: From HPI SOLMON BOHR is a 63 y.o. Caucasian male with medical history significant for osteoarthritis, BPH and tobacco abuse, who presented to the emergency room with acute onset of lower abdominal pain with significant diminished urine output over the last 4 days.  He admitted to nausea without vomiting or diarrhea.  He has been having tactile fever and chills.  He denied any chest pain or palpitations.  No cough or wheezing or dyspnea.  He admits to mild headache and dizziness.  The last time he saw physician was 3 years ago.  He denies any history of hypertension, diabetes mellitus or chronic kidney disease.   After insertion of a Foley catheter in the ED, the patient had more than 1 L of urine output and felt much better.   ED Course: When the patient came to the ER, BP was 169/118 and later 160/94 with otherwise normal vital signs.  Labs revealed mild hyponatremia 134 and hypochloremia of 89 with mild hyperkalemia 5.4, BUN of 102 and creatinine of 9.62 with no previous levels to compare.  Calcium was 8.5 and anion gap 30.  Lactic acid was 2.4 and later 2.6.  CBC was normal except for thrombocytosis of 461. EKG as reviewed by me : EKG showed normal sinus rhythm with rate of 71 with slightly poor R wave progression and biatrial enlargement with left axis deviation and borderline prolonged QT interval with QTc of 500 mL. Imaging: Abdominal pelvic CT scan without contrast revealed the following no acute abnormality in the abdomen or pelvis.   The patient was given 2 L bolus of IV lactated ringer.  He will be admitted to a progressive unit bed for further evaluation and management.    Assessment and Plan:  # AKI (acute kidney injury): Resolved - Secondary to urinary retention.  After Foley catheterization 3.6 L of urine  drained Renal function is improving S/p IV hydration.  Continue oral hydration Nephrologist on board and case discussed Monitor renal indicis Avoid nephrotoxic drugs Renally dose all medications sCr 0.92, gradually improved. Aki resolved 10/23 discontinue Foley catheter today and follow voiding trial, bladder scan every 6 hourly    # Hypertensive urgency Blood pressure improved Currently stable without medications. 10/22 BP fluctuating, use hydralazine p.o. vs IV according to BP parameters Monitor BP and titrate medication accordingly  # Vitamin B12 level 353, goal >400.  Started vitamin B12 1000 mcg p.o. daily.  Follow-up with PCP to repeat B12 level after 3 to 6 months.  # Hypophosphatemia due to nutritional deficiency.  Phos repleted. # Hypomagnesemia, mag repleted. Check electrolytes daily and replete as needed.  # Right foot pain possible pseudogout Uric acid 5.8 WNL Patient stated that he has a history of gout arthritis. Colchicine 0.6 mg, 1 dose followed by 0.3 mg p.o. daily ordered 10/21 Started prednisone  40 mg p.o. daily for 5 days 10/22 Toradol 15 mg x 1 dose given Continue as needed pain meds  # Constipation Continue MiraLAX twice daily and Dulcolax nightly 10/23 Patient refused suppository and enema, patient would like to drink GoLytely. Started MiraLAX 119 mg x 1 dose     DVT prophylaxis: Lovenox  Advanced Care Planning:  Code Status: full code.  Family Communication: None present at bedside  Disposition Plan: Patient is homeless, [facility] consulted for safe dispo plan.  Most likely discharge tomorrow a.m.   Consults called: Nephrology. Urology     Subjective:  No acute events overnight. Right foot pain is better, 5-6/10, patient is complaining of having constipation, does not want suppository or enema.  Would like to drink GoLytely.   Patient was informed that we will remove the Foley catheter today and follow voiding trial Patient was asking when he  can be discharged, most likely tomorrow a.m.    Physical Exam: General: NAD, lying comfortably Appear in no distress, affect appropriate Eyes: PERRLA ENT: Oral Mucosa Clear, moist  Neck: no JVD,  Cardiovascular: S1 and S2 Present, no Murmur,  Respiratory: good respiratory effort, Bilateral Air entry equal and Decreased, no Crackles, no wheezes Abdomen: Bowel Sound present, Soft and no tenderness,  Skin: no rashes Extremities: Right foot mild erythema and tenderness, no calf tenderness Neurologic: without any new focal findings Gait not checked due to patient safety concerns   Vitals:   02/20/24 1948 02/21/24 0434 02/21/24 0631 02/21/24 0740  BP: (!) 149/81 131/78  (!) 151/83  Pulse: 90 (!) 116 87 87  Resp:    18  Temp: 97.6 F (36.4 C) 97.7 F (36.5 C)  (!) 97.4 F (36.3 C)  TempSrc: Oral Oral    SpO2: 99% 97%  100%  Weight:      Height:        Data Reviewed:     Latest Ref Rng & Units 02/21/2024    4:50 AM 02/20/2024    4:26 AM 02/19/2024    5:44 AM  CBC  WBC 4.0 - 10.5 K/uL 13.6  12.1  7.2   Hemoglobin 13.0 - 17.0 g/dL 88.7  88.8  88.8   Hematocrit 39.0 - 52.0 % 34.9  33.6  34.1   Platelets 150 - 400 K/uL 325  299  261        Latest Ref Rng & Units 02/21/2024    4:50 AM 02/20/2024    4:26 AM 02/19/2024    5:44 AM  BMP  Glucose 70 - 99 mg/dL 849  808  886   BUN 8 - 23 mg/dL 33  27  33   Creatinine 0.61 - 1.24 mg/dL 8.80  9.03  9.07   Sodium 135 - 145 mmol/L 138  134  137   Potassium 3.5 - 5.1 mmol/L 4.9  4.6  4.0   Chloride 98 - 111 mmol/L 103  100  103   CO2 22 - 32 mmol/L 27  27  26    Calcium 8.9 - 10.3 mg/dL 8.4  8.2  7.8        Time spent: 40 minutes  Author: Elvan Sor, MD 02/21/2024 12:00 PM  For on call review [email or web address].

## 2024-02-21 NOTE — Progress Notes (Signed)
 Nutrition Follow-up  DOCUMENTATION CODES:   Underweight, Severe malnutrition in context of social or environmental circumstances  INTERVENTION:   -Continue regular diet for widest variety of meal selections -Continue MVI with minerals daily -Continue Ensure Plus High Protein po TID, each supplement provides 350 kcal and 20 grams of protein   NUTRITION DIAGNOSIS:   Severe Malnutrition related to social / environmental circumstances as evidenced by moderate muscle depletion, severe muscle depletion, moderate fat depletion, severe fat depletion.  Ongoing  GOAL:   Patient will meet greater than or equal to 90% of their needs  Progressing   MONITOR:   PO intake, Supplement acceptance  REASON FOR ASSESSMENT:   Malnutrition Screening Tool    ASSESSMENT:   Patient with medical history significant for osteoarthritis, BPH and tobacco abuse, who presented with acute onset of lower abdominal pain with significant diminished urine output over the last 4 days.  Reviewed I/O's: -265 ml x 24 hours and -12.4 L since admission  UOP: 925 ml x 24 hours   He remains on a regular diet with good oral intake. Noted meal completions 100%. He is drinking Ensure supplements.   No new weight since last visit.   Per MD notes, likely plan to discharge tomorrow (02/22/24)  Medications reviewed and include vitamin B-12, lovenox, magnesium oxide, protonix, miralax, and prednisone . Noted last BM on 02/21/24; bowel regimen ordered.   Labs reviewed.    Diet Order:   Diet Order             Diet regular Room service appropriate? Yes; Fluid consistency: Thin  Diet effective now                   EDUCATION NEEDS:   No education needs have been identified at this time  Skin:  Skin Assessment: Reviewed RN Assessment  Last BM:  02/16/24  Height:   Ht Readings from Last 1 Encounters:  02/16/24 6' (1.829 m)    Weight:   Wt Readings from Last 1 Encounters:  02/16/24 59 kg     Ideal Body Weight:  80.9 kg  BMI:  Body mass index is 17.63 kg/m.  Estimated Nutritional Needs:   Kcal:  7649-7449  Protein:  115-130 grams  Fluid:  2.0-2.2 L    Margery ORN, RD, LDN, CDCES Registered Dietitian III Certified Diabetes Care and Education Specialist If unable to reach this RD, please use RD Inpatient group chat on secure chat between hours of 8am-4 pm daily

## 2024-02-21 NOTE — Plan of Care (Signed)
  Problem: Education: Goal: Knowledge of General Education information will improve Description: Including pain rating scale, medication(s)/side effects and non-pharmacologic comfort measures Outcome: Progressing   Problem: Health Behavior/Discharge Planning: Goal: Ability to manage health-related needs will improve Outcome: Progressing   Problem: Clinical Measurements: Goal: Ability to maintain clinical measurements within normal limits will improve Outcome: Progressing Goal: Will remain free from infection Outcome: Progressing Goal: Diagnostic test results will improve Outcome: Progressing Goal: Respiratory complications will improve Outcome: Progressing Goal: Cardiovascular complication will be avoided Outcome: Progressing   Problem: Activity: Goal: Risk for activity intolerance will decrease Outcome: Progressing   Problem: Nutrition: Goal: Adequate nutrition will be maintained Outcome: Progressing   Problem: Coping: Goal: Level of anxiety will decrease Outcome: Progressing   Problem: Elimination: Goal: Will not experience complications related to bowel motility Outcome: Progressing Goal: Will not experience complications related to urinary retention Outcome: Not Progressing   Problem: Pain Managment: Goal: General experience of comfort will improve and/or be controlled Outcome: Progressing   Problem: Safety: Goal: Ability to remain free from injury will improve Outcome: Progressing   Problem: Skin Integrity: Goal: Risk for impaired skin integrity will decrease Outcome: Progressing

## 2024-02-21 NOTE — Progress Notes (Signed)
 Patient stated that he feels so sick, lower left abdominal pain, feeling bloated, cold and clammy, and sicker now than when he came in.   Gave zoloft for nausea and took vitals.   Vitals normal. MD aware.

## 2024-02-22 ENCOUNTER — Other Ambulatory Visit: Payer: Self-pay

## 2024-02-22 DIAGNOSIS — N179 Acute kidney failure, unspecified: Secondary | ICD-10-CM | POA: Diagnosis not present

## 2024-02-22 LAB — MAGNESIUM: Magnesium: 1.8 mg/dL (ref 1.7–2.4)

## 2024-02-22 LAB — CBC
HCT: 37.4 % — ABNORMAL LOW (ref 39.0–52.0)
Hemoglobin: 11.8 g/dL — ABNORMAL LOW (ref 13.0–17.0)
MCH: 29.9 pg (ref 26.0–34.0)
MCHC: 31.6 g/dL (ref 30.0–36.0)
MCV: 94.9 fL (ref 80.0–100.0)
Platelets: 375 K/uL (ref 150–400)
RBC: 3.94 MIL/uL — ABNORMAL LOW (ref 4.22–5.81)
RDW: 13 % (ref 11.5–15.5)
WBC: 13.6 K/uL — ABNORMAL HIGH (ref 4.0–10.5)
nRBC: 0 % (ref 0.0–0.2)

## 2024-02-22 LAB — BASIC METABOLIC PANEL WITH GFR
Anion gap: 11 (ref 5–15)
BUN: 29 mg/dL — ABNORMAL HIGH (ref 8–23)
CO2: 25 mmol/L (ref 22–32)
Calcium: 8.6 mg/dL — ABNORMAL LOW (ref 8.9–10.3)
Chloride: 104 mmol/L (ref 98–111)
Creatinine, Ser: 0.84 mg/dL (ref 0.61–1.24)
GFR, Estimated: >60 mL/min (ref >60)
Glucose, Bld: 113 mg/dL — ABNORMAL HIGH (ref 70–99)
Potassium: 5.4 mmol/L — ABNORMAL HIGH (ref 3.5–5.1)
Sodium: 140 mmol/L (ref 135–145)

## 2024-02-22 LAB — PHOSPHORUS: Phosphorus: 3.5 mg/dL (ref 2.5–4.6)

## 2024-02-22 MED ORDER — SMOG ENEMA
960.0000 mL | Freq: Once | RECTAL | Status: DC
  Filled 2024-02-22: qty 960

## 2024-02-22 MED ORDER — SODIUM ZIRCONIUM CYCLOSILICATE 10 G PO PACK
10.0000 g | PACK | Freq: Once | ORAL | Status: AC
  Administered 2024-02-22: 10 g via ORAL
  Filled 2024-02-22: qty 1

## 2024-02-22 MED ORDER — TAMSULOSIN HCL 0.4 MG PO CAPS
0.4000 mg | ORAL_CAPSULE | Freq: Every day | ORAL | 11 refills | Status: DC
  Filled 2024-02-22: qty 30, 30d supply, fill #0

## 2024-02-22 MED ORDER — CYANOCOBALAMIN 1000 MCG PO TABS
1000.0000 ug | ORAL_TABLET | Freq: Every day | ORAL | 2 refills | Status: AC
  Filled 2024-02-22: qty 30, 30d supply, fill #0

## 2024-02-22 NOTE — Progress Notes (Signed)
 The patient received a phone call from his brother that his dog died. The patient was very upset and told this Clinical research associate that he wanted to go home. This Clinical research associate notified Doctor Delayne Solian, MD and this writer received order for PRN Alprazolam. This writer gave PRN Alprazolam, the patient calmed down and went to sleep. The patient refused CBC this morning per lab. The patient refused to have Bladder scan done.

## 2024-02-22 NOTE — Plan of Care (Signed)

## 2024-02-22 NOTE — Plan of Care (Signed)

## 2024-02-22 NOTE — Progress Notes (Signed)
 Progress Note   Patient: Francisco Woods FMW:[id number] DOB: [birth year] DOA: 02/15/2024     7 DOS: the patient was seen and examined on 02/22/2024   Brief hospital course: From HPI Francisco Woods is a 63 y.o. Caucasian male with medical history significant for osteoarthritis, BPH and tobacco abuse, who presented to the emergency room with acute onset of lower abdominal pain with significant diminished urine output over the last 4 days.  He admitted to nausea without vomiting or diarrhea.  He has been having tactile fever and chills.  He denied any chest pain or palpitations.  No cough or wheezing or dyspnea.  He admits to mild headache and dizziness.  The last time he saw physician was 3 years ago.  He denies any history of hypertension, diabetes mellitus or chronic kidney disease.   After insertion of a Foley catheter in the ED, the patient had more than 1 L of urine output and felt much better.   ED Course: When the patient came to the ER, BP was 169/118 and later 160/94 with otherwise normal vital signs.  Labs revealed mild hyponatremia 134 and hypochloremia of 89 with mild hyperkalemia 5.4, BUN of 102 and creatinine of 9.62 with no previous levels to compare.  Calcium was 8.5 and anion gap 30.  Lactic acid was 2.4 and later 2.6.  CBC was normal except for thrombocytosis of 461. EKG as reviewed by me : EKG showed normal sinus rhythm with rate of 71 with slightly poor R wave progression and biatrial enlargement with left axis deviation and borderline prolonged QT interval with QTc of 500 mL. Imaging: Abdominal pelvic CT scan without contrast revealed the following no acute abnormality in the abdomen or pelvis.   The patient was given 2 L bolus of IV lactated ringer.  He will be admitted to a progressive unit bed for further evaluation and management.    Assessment and Plan:  # AKI (acute kidney injury): Resolved - Secondary to urinary retention.  After Foley catheterization 3.6 L of urine  drained Renal function is improving S/p IV hydration.  Continue oral hydration Nephrologist on board and case discussed Monitor renal indicis Avoid nephrotoxic drugs Renally dose all medications sCr 0.92, gradually improved. Aki resolved 10/23 discontinue Foley catheter today and follow voiding trial, bladder scan every 6 hourly 10/24 Foley catheter was removed.  Bladder scan showed >700, patient voided about 200 ml and 2nd time voided 300 ml. We will continue to do bladder scan every 6 hourly to make sure and then discharge plan tomorrow a.m. Continue Flomax   # Hypertensive urgency Blood pressure improved Currently stable without medications. 10/22 BP fluctuating, use hydralazine p.o. vs IV according to BP parameters Monitor BP and titrate medication accordingly  # Vitamin B12 level 353, goal >400.  Started vitamin B12 1000 mcg p.o. daily.  Follow-up with PCP to repeat B12 level after 3 to 6 months.  # Hypophosphatemia due to nutritional deficiency.  Phos repleted. # Hypomagnesemia, mag repleted. Check electrolytes daily and replete as needed.  # Right foot pain possible pseudogout Uric acid 5.8 WNL Patient stated that he has a history of gout arthritis. Colchicine 0.6 mg, 1 dose followed by 0.3 mg p.o. daily ordered 10/21 Started prednisone  40 mg p.o. daily for 5 days 10/22 Toradol 15 mg x 1 dose given Continue as needed pain meds 10/24 right foot pain resolved.  Patient is ambulating well   # Hyperkalemia, mild.  Lokelma 10 g x 1 dose given.  #  Constipation: Resolved  Continue MiraLAX twice daily and Dulcolax nightly 10/23 Patient refused suppository and enema, patient would like to drink GoLytely. Started MiraLAX 119 mg x 1 dose  10/24 patient moved bowels.  Constipation resolved.   DVT prophylaxis: Lovenox  Advanced Care Planning:  Code Status: full code.  Family Communication: None present at bedside  Disposition Plan: Patient is homeless, [facility] consulted for  safe dispo plan.  Most likely discharge tomorrow a.m.   Consults called: Nephrology. Urology     Subjective:  No acute events overnight. Right foot pain almost resolved.  Patient did move bowels.  Denies any complaint.  Patient agreed with the Foley catheter discontinuation and discharge planning today. But patient was retaining urine so we will continue to do bladder scan today and plan for discharge tomorrow a.m.     Physical Exam: General: NAD, lying comfortably Appear in no distress, affect appropriate Eyes: PERRLA ENT: Oral Mucosa Clear, moist  Neck: no JVD,  Cardiovascular: S1 and S2 Present, no Murmur,  Respiratory: good respiratory effort, Bilateral Air entry equal and Decreased, no Crackles, no wheezes Abdomen: Bowel Sound present, Soft and no tenderness,  Skin: no rashes Extremities: Right foot mild erythema and tenderness, no calf tenderness Neurologic: without any new focal findings Gait not checked due to patient safety concerns   Vitals:   02/21/24 1815 02/22/24 0357 02/22/24 0853 02/22/24 1536  BP: 125/63 (!) 161/86 (!) 157/84 (!) 156/87  Pulse: 88 93 86 93  Resp: 18  19 19   Temp: 97.9 F (36.6 C) 98 F (36.7 C) 97.6 F (36.4 C) 97.8 F (36.6 C)  TempSrc: Oral     SpO2: 100% 99% 100% 100%  Weight:      Height:        Data Reviewed:     Latest Ref Rng & Units 02/22/2024    7:59 AM 02/21/2024    4:50 AM 02/20/2024    4:26 AM  CBC  WBC 4.0 - 10.5 K/uL 13.6  13.6  12.1   Hemoglobin 13.0 - 17.0 g/dL 88.1  88.7  88.8   Hematocrit 39.0 - 52.0 % 37.4  34.9  33.6   Platelets 150 - 400 K/uL 375  325  299        Latest Ref Rng & Units 02/22/2024    7:59 AM 02/21/2024    4:50 AM 02/20/2024    4:26 AM  BMP  Glucose 70 - 99 mg/dL 886  849  808   BUN 8 - 23 mg/dL 29  33  27   Creatinine 0.61 - 1.24 mg/dL 9.15  8.80  9.03   Sodium 135 - 145 mmol/L 140  138  134   Potassium 3.5 - 5.1 mmol/L 5.4  4.9  4.6   Chloride 98 - 111 mmol/L 104  103  100    CO2 22 - 32 mmol/L 25  27  27    Calcium 8.9 - 10.3 mg/dL 8.6  8.4  8.2        Time spent: 40 minutes  Author: Elvan Sor, MD 02/22/2024 5:20 PM  For on call review [email or web address].

## 2024-02-22 NOTE — TOC Progression Note (Addendum)
 Transition of Care [facility]) - Progression Note    Patient Details  Name: Francisco Woods MRN: [id number] Date of Birth: [birth year]  [facility]) CM/SW Contact  Dalia GORMAN Fuse, RN Phone Number: 02/22/2024, 8:25 AM  Clinical Narrative:    Foley dc'd on 10/23. Completing voiding trials. Resources for housing placed on the patient's AVS. Housing resources have been added to the patient's AVS. TOC will provide taxi voucher if needed.   10:50: TOC spoke with the patient in his room. He advised that his brother may pick him up if he is available. TOC offered list of shelters and the patient verbalized he doesn't want to go to a shelter. He plans to call a brother or a friend.  TOC placed taxi voucher on the patient's chart if needed.                    Expected Discharge Plan and Services                                               Social Drivers of Health (SDOH) Interventions SDOH Screenings   Food Insecurity: No Food Insecurity (02/16/2024)  Housing: High Risk (02/19/2024)  Transportation Needs: No Transportation Needs (02/16/2024)  Utilities: Not At Risk (02/16/2024)  Social Connections: Socially Isolated (02/16/2024)  Tobacco Use: High Risk (02/16/2024)    Readmission Risk Interventions     No data to display

## 2024-02-23 ENCOUNTER — Other Ambulatory Visit: Payer: Self-pay

## 2024-02-23 DIAGNOSIS — N179 Acute kidney failure, unspecified: Secondary | ICD-10-CM | POA: Diagnosis not present

## 2024-02-23 LAB — BASIC METABOLIC PANEL WITH GFR
Anion gap: 9 (ref 5–15)
BUN: 36 mg/dL — ABNORMAL HIGH (ref 8–23)
CO2: 23 mmol/L (ref 22–32)
Calcium: 8.1 mg/dL — ABNORMAL LOW (ref 8.9–10.3)
Chloride: 105 mmol/L (ref 98–111)
Creatinine, Ser: 0.82 mg/dL (ref 0.61–1.24)
GFR, Estimated: >60 mL/min (ref >60)
Glucose, Bld: 142 mg/dL — ABNORMAL HIGH (ref 70–99)
Potassium: 4.6 mmol/L (ref 3.5–5.1)
Sodium: 137 mmol/L (ref 135–145)

## 2024-02-23 LAB — CBC
HCT: 33.6 % — ABNORMAL LOW (ref 39.0–52.0)
Hemoglobin: 10.7 g/dL — ABNORMAL LOW (ref 13.0–17.0)
MCH: 29.9 pg (ref 26.0–34.0)
MCHC: 31.8 g/dL (ref 30.0–36.0)
MCV: 93.9 fL (ref 80.0–100.0)
Platelets: 409 K/uL — ABNORMAL HIGH (ref 150–400)
RBC: 3.58 MIL/uL — ABNORMAL LOW (ref 4.22–5.81)
RDW: 13.2 % (ref 11.5–15.5)
WBC: 15.3 K/uL — ABNORMAL HIGH (ref 4.0–10.5)
nRBC: 0 % (ref 0.0–0.2)

## 2024-02-23 LAB — MAGNESIUM: Magnesium: 1.8 mg/dL (ref 1.7–2.4)

## 2024-02-23 LAB — PHOSPHORUS: Phosphorus: 3.1 mg/dL (ref 2.5–4.6)

## 2024-02-23 MED ORDER — TAMSULOSIN HCL 0.4 MG PO CAPS
0.8000 mg | ORAL_CAPSULE | Freq: Every day | ORAL | Status: DC
  Administered 2024-02-23: 0.8 mg via ORAL
  Filled 2024-02-23: qty 2

## 2024-02-23 NOTE — Plan of Care (Signed)

## 2024-02-23 NOTE — Plan of Care (Signed)
  Problem: Clinical Measurements: Goal: Ability to maintain clinical measurements within normal limits will improve Outcome: Progressing Goal: Cardiovascular complication will be avoided Outcome: Progressing   Problem: Nutrition: Goal: Adequate nutrition will be maintained Outcome: Progressing   Problem: Elimination: Goal: Will not experience complications related to bowel motility Outcome: Progressing

## 2024-02-23 NOTE — Progress Notes (Signed)
 Progress Note   Patient: Francisco Woods FMW:[id number] DOB: [birth year] DOA: 02/15/2024     8 DOS: the patient was seen and examined on 02/23/2024   Brief hospital course: From HPI Francisco Woods is a 63 y.o. Caucasian male with medical history significant for osteoarthritis, BPH and tobacco abuse, who presented to the emergency room with acute onset of lower abdominal pain with significant diminished urine output over the last 4 days.  He admitted to nausea without vomiting or diarrhea.  He has been having tactile fever and chills.  He denied any chest pain or palpitations.  No cough or wheezing or dyspnea.  He admits to mild headache and dizziness.  The last time he saw physician was 3 years ago.  He denies any history of hypertension, diabetes mellitus or chronic kidney disease.   After insertion of a Foley catheter in the ED, the patient had more than 1 L of urine output and felt much better.   ED Course: When the patient came to the ER, BP was 169/118 and later 160/94 with otherwise normal vital signs.  Labs revealed mild hyponatremia 134 and hypochloremia of 89 with mild hyperkalemia 5.4, BUN of 102 and creatinine of 9.62 with no previous levels to compare.  Calcium was 8.5 and anion gap 30.  Lactic acid was 2.4 and later 2.6.  CBC was normal except for thrombocytosis of 461. EKG as reviewed by me : EKG showed normal sinus rhythm with rate of 71 with slightly poor R wave progression and biatrial enlargement with left axis deviation and borderline prolonged QT interval with QTc of 500 mL. Imaging: Abdominal pelvic CT scan without contrast revealed the following no acute abnormality in the abdomen or pelvis.   The patient was given 2 L bolus of IV lactated ringer.  He will be admitted to a progressive unit bed for further evaluation and management.    Assessment and Plan:  # AKI (acute kidney injury): Resolved - Secondary to urinary retention.  After Foley catheterization 3.6 L of urine  drained Renal function is improving S/p IV hydration.  Continue oral hydration Nephrologist on board and case discussed Monitor renal indicis Avoid nephrotoxic drugs Renally dose all medications sCr 0.92, gradually improved. Aki resolved 10/23 discontinue Foley catheter today and follow voiding trial, bladder scan every 6 hourly 10/24 Foley catheter was removed.  Bladder scan showed >700, patient voided about 200 ml and 2nd time voided 300 ml. We will continue to do bladder scan every 6 hourly to make sure and then discharge plan tomorrow a.m. Continue Flomax 10/25 patient failed voiding trial, again has urinary retention and Foley catheter was reinserted today.  More than 1 L urine was collected after Foley catheter insertion.  Plan is to discharge patient with Foley catheter when he is stable.   # Hypertensive urgency Blood pressure improved Currently stable without medications. 10/22 BP fluctuating, use hydralazine p.o. vs IV according to BP parameters Monitor BP and titrate medication accordingly  # Vitamin B12 level 353, goal >400.  Started vitamin B12 1000 mcg p.o. daily.  Follow-up with PCP to repeat B12 level after 3 to 6 months.  # Hypophosphatemia due to nutritional deficiency.  Phos repleted. # Hypomagnesemia, mag repleted. Check electrolytes daily and replete as needed.  # Right foot pain possible pseudogout Uric acid 5.8 WNL Patient stated that he has a history of gout arthritis. Colchicine 0.6 mg, 1 dose followed by 0.3 mg p.o. daily ordered 10/21 Started prednisone  40 mg p.o.  daily for 5 days 10/22 Toradol 15 mg x 1 dose given Continue as needed pain meds 10/24 right foot pain resolved.  Patient is ambulating well  # Hyperkalemia, mild.  Lokelma 10 g x 1 dose given.  Resolved  # Constipation: Resolved  Continue MiraLAX twice daily and Dulcolax nightly 10/23 Patient refused suppository and enema, patient would like to drink GoLytely. Started MiraLAX 119 mg x 1  dose  10/24 patient moved bowels.  Constipation resolved.   DVT prophylaxis: Lovenox  Advanced Care Planning:  Code Status: full code.  Family Communication: None present at bedside  Disposition Plan: Patient is homeless, [facility] consulted for safe dispo plan.  Most likely discharge tomorrow a.m.   Consults called: Nephrology. Urology     Subjective:  No acute events overnight. Patient is unable to void, agreed for Foley catheter insertion. Overall patient does not feel good, so we will continue to monitor today and plan for discharge tomorrow a.m.    Physical Exam: General: NAD, lying comfortably Appear in no distress, affect appropriate Eyes: PERRLA ENT: Oral Mucosa Clear, moist  Neck: no JVD,  Cardiovascular: S1 and S2 Present, no Murmur,  Respiratory: good respiratory effort, Bilateral Air entry equal and Decreased, no Crackles, no wheezes Abdomen: Bowel Sound present, Soft and no tenderness,  Skin: no rashes Extremities: No edema, no calf tenderness Neurologic: without any new focal findings Gait not checked due to patient safety concerns   Vitals:   02/22/24 1536 02/22/24 1943 02/23/24 0421 02/23/24 0805  BP: (!) 156/87 134/83 (!) 152/94 135/88  Pulse: 93 88 99 85  Resp: 19 20  18   Temp: 97.8 F (36.6 C) (!) 97.5 F (36.4 C) 97.8 F (36.6 C) 97.9 F (36.6 C)  TempSrc:    Oral  SpO2: 100% 100% 100% 100%  Weight:      Height:        Data Reviewed:     Latest Ref Rng & Units 02/23/2024    5:02 AM 02/22/2024    7:59 AM 02/21/2024    4:50 AM  CBC  WBC 4.0 - 10.5 K/uL 15.3  13.6  13.6   Hemoglobin 13.0 - 17.0 g/dL 89.2  88.1  88.7   Hematocrit 39.0 - 52.0 % 33.6  37.4  34.9   Platelets 150 - 400 K/uL 409  375  325        Latest Ref Rng & Units 02/23/2024    5:02 AM 02/22/2024    7:59 AM 02/21/2024    4:50 AM  BMP  Glucose 70 - 99 mg/dL 857  886  849   BUN 8 - 23 mg/dL 36  29  33   Creatinine 0.61 - 1.24 mg/dL 9.17  9.15  8.80   Sodium 135 -  145 mmol/L 137  140  138   Potassium 3.5 - 5.1 mmol/L 4.6  5.4  4.9   Chloride 98 - 111 mmol/L 105  104  103   CO2 22 - 32 mmol/L 23  25  27    Calcium 8.9 - 10.3 mg/dL 8.1  8.6  8.4        Time spent: 40 minutes  Author: Elvan Sor, MD 02/23/2024 3:42 PM  For on call review [email or web address].

## 2024-02-23 NOTE — Progress Notes (Signed)
 Rounded on patient. Patient states he doesn't feel well and is upset about having to have foley cath reinserted, although he understands the reasoning behind it. Patient aware he will likely need to d/c with foley in place. Unsure of how that will look giving his housing insecurity.  He does NOT have regular access to water, etc. MD aware. Bedside nurse to provide foley care instructions.   When asked again about substance use, patient admits to having previously used and the likelihood of using again while on the street. Patient provided with information for [facility]. Plans to call today. Foley may be a barrier for this.

## 2024-02-24 DIAGNOSIS — N179 Acute kidney failure, unspecified: Secondary | ICD-10-CM | POA: Diagnosis not present

## 2024-02-24 LAB — CBC
HCT: 34.8 % — ABNORMAL LOW (ref 39.0–52.0)
Hemoglobin: 11.2 g/dL — ABNORMAL LOW (ref 13.0–17.0)
MCH: 30.2 pg (ref 26.0–34.0)
MCHC: 32.2 g/dL (ref 30.0–36.0)
MCV: 93.8 fL (ref 80.0–100.0)
Platelets: 389 K/uL (ref 150–400)
RBC: 3.71 MIL/uL — ABNORMAL LOW (ref 4.22–5.81)
RDW: 13.3 % (ref 11.5–15.5)
WBC: 14.9 K/uL — ABNORMAL HIGH (ref 4.0–10.5)
nRBC: 0 % (ref 0.0–0.2)

## 2024-02-24 LAB — BASIC METABOLIC PANEL WITH GFR
Anion gap: 11 (ref 5–15)
BUN: 30 mg/dL — ABNORMAL HIGH (ref 8–23)
CO2: 25 mmol/L (ref 22–32)
Calcium: 8.4 mg/dL — ABNORMAL LOW (ref 8.9–10.3)
Chloride: 102 mmol/L (ref 98–111)
Creatinine, Ser: 0.75 mg/dL (ref 0.61–1.24)
GFR, Estimated: >60 mL/min (ref >60)
Glucose, Bld: 124 mg/dL — ABNORMAL HIGH (ref 70–99)
Potassium: 4.5 mmol/L (ref 3.5–5.1)
Sodium: 138 mmol/L (ref 135–145)

## 2024-02-24 LAB — PHOSPHORUS: Phosphorus: 3.7 mg/dL (ref 2.5–4.6)

## 2024-02-24 LAB — MAGNESIUM: Magnesium: 1.8 mg/dL (ref 1.7–2.4)

## 2024-02-24 MED ORDER — TAMSULOSIN HCL 0.4 MG PO CAPS
0.8000 mg | ORAL_CAPSULE | Freq: Every day | ORAL | Status: AC
  Administered 2024-02-24: 0.8 mg via ORAL
  Filled 2024-02-24: qty 2

## 2024-02-24 MED ORDER — TRAZODONE HCL 50 MG PO TABS: 50.0000 mg | ORAL_TABLET | Freq: Every evening | ORAL | Status: DC | PRN

## 2024-02-24 NOTE — Progress Notes (Signed)
 Patient is alert and oriented X 4. Education given about foley's care as well as how to use the urine leg bag. He remonstrated  well with proper teach back method. No any questions at this time.

## 2024-02-24 NOTE — Plan of Care (Signed)

## 2024-02-24 NOTE — Progress Notes (Addendum)
 Patient is alert and oriented X 4. Discharge instruction given. No any questions at this time.  He is going home with foley's catheter. Education given. Peripheral I/v removed.

## 2024-02-24 NOTE — Discharge Summary (Signed)
 [facility] Discharge Summary   Patient: Francisco Woods FMW:[id number]  PCP: Pcp, No  Date of admission: 02/15/2024   Date of discharge:  02/24/2024     Discharge Diagnoses:  Principal Problem:   AKI (acute kidney injury) Active Problems:   Hypertensive urgency   Protein-calorie malnutrition, severe   Admitted From: Community, patient is homeless Disposition:   Back to community, housing information was given by TOC.  Recommendations for Outpatient Follow-up:  Follow-up with PCP in 1 week.  Monitor BP at home and follow with PCP. Follow-up with urology in 1 week for voiding trial as an outpatient. Follow up LABS/TEST:     Follow-up Information     PCP Follow up in 1 week(s).          Georganne Penne SAUNDERS, MD Follow up in 1 week(s).   Specialty: Urology Contact information: [address] [place] [address] [phone]                Diet recommendation: Cardiac diet  Activity: The patient is advised to gradually reintroduce usual activities, as tolerated  Discharge Condition: stable  Code Status: Full code   History of present illness: As per the H and P dictated on admission.  Hospital Course:  From HPI Francisco Woods is a 63 y.o. Caucasian male with medical history significant for osteoarthritis, BPH and tobacco abuse, who presented to the emergency room with acute onset of lower abdominal pain with significant diminished urine output over the last 4 days.  He admitted to nausea without vomiting or diarrhea.  He has been having tactile fever and chills.  He denied any chest pain or palpitations.  No cough or wheezing or dyspnea.  He admits to mild headache and dizziness.  The last time he saw physician was 3 years ago.  He denies any history of hypertension, diabetes mellitus or chronic kidney disease.   After insertion of a Foley catheter in the ED, the patient had more than 1 L of urine output and felt much better.   ED Course: When the patient  came to the ER, BP was 169/118 and later 160/94 with otherwise normal vital signs.  Labs revealed mild hyponatremia 134 and hypochloremia of 89 with mild hyperkalemia 5.4, BUN of 102 and creatinine of 9.62 with no previous levels to compare.  Calcium was 8.5 and anion gap 30.  Lactic acid was 2.4 and later 2.6.  CBC was normal except for thrombocytosis of 461. EKG as reviewed by me : EKG showed normal sinus rhythm with rate of 71 with slightly poor R wave progression and biatrial enlargement with left axis deviation and borderline prolonged QT interval with QTc of 500 mL. Imaging: Abdominal pelvic CT scan without contrast revealed the following no acute abnormality in the abdomen or pelvis.   The patient was given 2 L bolus of IV lactated ringer.  He will be admitted to a progressive unit bed for further evaluation and management.     Assessment and Plan:   # AKI (acute kidney injury): Resolved - Secondary to urinary retention.  After Foley catheterization 3.6 L of urine drained.  Renal functions improved, AKI resolved. S/p IV hydration.  Continue oral hydration Nephrology was consulted.    # Urinary retention secondary to BPH S/p Flomax 0.8 mg x 2 doses followed by Flomax 0.4 mg given and discharged on same dose  10/23 discontinue Foley catheter today and follow voiding trial, bladder scan every 6 hourly 10/24 Foley catheter  was removed.  Bladder scan showed >700, patient voided about 200 ml and 2nd time voided 300 ml.  Patient was kept overnight for monitoring and serial bladder scan. 10/25 patient failed voiding trial, again has urinary retention and Foley catheter was reinserted today.  More than 1 L urine was collected after Foley catheter insertion.  10/26 patient was given multiple extra bags and supplies with education how to take care of Foley catheter.  Patient verbalized understanding.  Agreed with the discharge plan.  Recommended to follow-up with urology as an outpatient in 1 week for  voiding trial.  # Hypertensive urgency possible due to urinary retention and renal failure. Blood pressure improved. Currently stable without medications.  Patient was advised to monitor BP at home and follow with PCP.  No need of antihypertensive medications at this time   # Vitamin B12 level 353, goal >400.  Started vitamin B12 1000 mcg p.o. daily.  Follow-up with PCP to repeat B12 level after 3 to 6 months.   # Hypophosphatemia due to nutritional deficiency.  Phos repleted, and resolved. # Hypomagnesemia, mag repleted.  Resolved # Hyperkalemia, mild.  Lokelma 10 g x 1 dose given.  Resolved  # Right foot pain possible pseudogout: Resolved Uric acid 5.8 WNL. Patient stated that he has a history of gout arthritis. S/p Colchicine 0.6 mg, 1 dose followed by 0.3 mg p.o. daily. 10/21 s/p prednisone  40 mg p.o. daily for 5 days 10/22 Toradol 15 mg x 1 dose given 10/24 right foot pain resolved.  Patient is ambulating well 10/26 denies any complaints, pain resolved.  Ambulating well.    # Constipation: Resolved  Continue MiraLAX twice daily and Dulcolax nightly 10/23 Patient refused suppository and enema, patient would like to drink GoLytely. S/p MiraLAX 119 mg x 1 dose  10/24 patient moved bowels.  Constipation resolved.    Body mass index is 17.63 kg/m.  Nutrition Problem: Severe Malnutrition Etiology: social / environmental circumstances Nutrition Interventions: Interventions: Ensure Enlive (each supplement provides 350kcal and 20 grams of protein), MVI, Liberalize Diet  Patient was ambulatory without any assistance.  On the day of the discharge the patient's vitals were stable, and no other acute medical condition were reported by patient. the patient was felt safe to be discharge back to the community, housing resources and information was given by TOC.   Consultants: Nephrology and urology Procedures: None  Discharge Exam: General: Appear in no distress, Oral Mucosa Clear,  moist. Cardiovascular: S1 and S2 Present, no Murmur, Respiratory: normal respiratory effort, Bilateral Air entry present and no Crackles, no wheezes Abdomen: Bowel Sound present, Soft and no tenderness. Extremities: no Pedal edema, no calf tenderness Neurology: alert and oriented to time, place, and person affect appropriate.  Filed Weights   02/15/24 1553 02/16/24 1400  Weight: 57.2 kg 59 kg   Vitals:   02/23/24 1943 02/24/24 0416  BP: (!) 142/81 116/76  Pulse: 88 84  Resp: 17 18  Temp: 97.9 F (36.6 C) 97.7 F (36.5 C)  SpO2: 100% 98%    DISCHARGE MEDICATION: Allergies as of 02/24/2024   No Known Allergies      Medication List     STOP taking these medications    oxyCODONE -acetaminophen  5-325 MG tablet Commonly known as: Roxicet   traMADol  50 MG tablet Commonly known as: Ultram        TAKE these medications    cyanocobalamin 1000 MCG tablet Commonly known as: VITAMIN B12 Take 1 tablet (1,000 mcg total) by mouth daily.  tamsulosin 0.4 MG Caps capsule Commonly known as: FLOMAX Take 1 capsule (0.4 mg total) by mouth daily after supper.       No Known Allergies Discharge Instructions     Call MD for:  difficulty breathing, headache or visual disturbances   Complete by: As directed    Call MD for:  extreme fatigue   Complete by: As directed    Call MD for:  persistant dizziness or light-headedness   Complete by: As directed    Call MD for:  persistant nausea and vomiting   Complete by: As directed    Call MD for:  severe uncontrolled pain   Complete by: As directed    Call MD for:  temperature >100.4   Complete by: As directed    Diet - low sodium heart healthy   Complete by: As directed    Discharge instructions   Complete by: As directed    Follow-up with PCP in 1 week.  Monitor BP at home and follow with PCP. Follow-up with urology in 1 week for voiding trial as an outpatient.   Increase activity slowly   Complete by: As directed         The results of significant diagnostics from this hospitalization (including imaging, microbiology, ancillary and laboratory) are listed below for reference.    Significant Diagnostic Studies: DG Abd 1 View Result Date: 02/21/2024 CLINICAL DATA:  Bowel obstruction lower abdominal pain per epic notes EXAM: ABDOMEN - 1 VIEW COMPARISON:  CT 02/15/2024 FINDINGS: Nonobstructed gas pattern with moderate stool in the colon. No radiopaque calculi. Catheter over the pelvis. IMPRESSION: Nonobstructed gas pattern with moderate stool in the colon. Electronically Signed   By: Luke Bun M.D.   On: 02/21/2024 19:24   CT ABDOMEN PELVIS WO CONTRAST Result Date: 02/15/2024 EXAM: CT ABDOMEN AND PELVIS WITHOUT CONTRAST 02/15/2024 07:03:57 PM TECHNIQUE: CT of the abdomen and pelvis was performed without the administration of intravenous contrast. Multiplanar reformatted images are provided for review. Automated exposure control, iterative reconstruction, and/or weight-based adjustment of the mA/kV was utilized to reduce the radiation dose to as low as reasonably achievable. COMPARISON: None available. CLINICAL HISTORY: Abdominal pain, acute, nonlocalized. Pt to ED via [facility] from home. C/C abdominal pain and distention. Pt has not had BM in 4-5 days. EMS reports pt has taken laxatives without relief. EMS reports axillary temp of 93.3. EMS states the home was not cold. FINDINGS: LOWER CHEST: No acute abnormality. LIVER: The liver is unremarkable. GALLBLADDER AND BILE DUCTS: Gallbladder is unremarkable. No biliary ductal dilatation. SPLEEN: No acute abnormality. PANCREAS: No acute abnormality. ADRENAL GLANDS: No acute abnormality. KIDNEYS, URETERS AND BLADDER: No stones in the kidneys or ureters. No hydronephrosis. No perinephric or periureteral stranding. Foley catheter and gas is present in the bladder. GI AND BOWEL: Stomach demonstrates no acute abnormality. There is no bowel obstruction. Colonic diverticulosis without  evidence of diverticulitis. PERITONEUM AND RETROPERITONEUM: No ascites. No free air. VASCULATURE: Aorta is normal in caliber. Aortic atherosclerotic calcification. LYMPH NODES: No lymphadenopathy. REPRODUCTIVE ORGANS: No acute abnormality. BONES AND SOFT TISSUES: No acute osseous abnormality. No focal soft tissue abnormality. IMPRESSION: 1. No acute abnormality in the abdomen or pelvis. Paucity of intraabdominal fat in combination with lack of IV or oral contrast limits assessment. Electronically signed by: Norman Gatlin MD 02/15/2024 07:56 PM EDT RP Workstation: [id number]    Microbiology: No results found for this or any previous visit (from the past 240 hours).   Labs: CBC: Recent Labs  Lab  02/20/24 0426 02/21/24 0450 02/22/24 0759 02/23/24 0502 02/24/24 0721  WBC 12.1* 13.6* 13.6* 15.3* 14.9*  HGB 11.1* 11.2* 11.8* 10.7* 11.2*  HCT 33.6* 34.9* 37.4* 33.6* 34.8*  MCV 90.3 93.6 94.9 93.9 93.8  PLT 299 325 375 409* 389   Basic Metabolic Panel: Recent Labs  Lab 02/20/24 0426 02/21/24 0450 02/22/24 0759 02/23/24 0502 02/24/24 0721  NA 134* 138 140 137 138  K 4.6 4.9 5.4* 4.6 4.5  CL 100 103 104 105 102  CO2 27 27 25 23 25   GLUCOSE 191* 150* 113* 142* 124*  BUN 27* 33* 29* 36* 30*  CREATININE 0.96 1.19 0.84 0.82 0.75  CALCIUM 8.2* 8.4* 8.6* 8.1* 8.4*  MG 1.7 2.0 1.8 1.8 1.8  PHOS 2.4* 2.8 3.5 3.1 3.7   Liver Function Tests: No results for input(s): AST, ALT, ALKPHOS, BILITOT, PROT, ALBUMIN in the last 168 hours. No results for input(s): LIPASE, AMYLASE in the last 168 hours. No results for input(s): AMMONIA in the last 168 hours. Cardiac Enzymes: No results for input(s): CKTOTAL, CKMB, CKMBINDEX, TROPONINI in the last 168 hours. BNP (last 3 results) No results for input(s): BNP in the last 8760 hours. CBG: No results for input(s): GLUCAP in the last 168 hours.  Time spent: 35 minutes  Signed:  Elvan Sor  [facility] 02/24/2024 12:45 PM

## 2024-02-25 ENCOUNTER — Ambulatory Visit: Admitting: Physician Assistant

## 2024-02-26 ENCOUNTER — Telehealth: Payer: Self-pay

## 2024-03-09 NOTE — Progress Notes (Deleted)
 03/11/2024 11:24 PM   Francisco Woods Sep 14, 1960 [id number]  Referring provider: No referring provider defined for this encounter.  Urological history: 1.   No chief complaint on file.  HPI: Francisco Woods is a 63 y.o. man who presents today for PVR.   Previous records reviewed.  Patient presented to the ED in October for abdominal pain and was found to be in urinary retention.  A Foley was placed and there was a return of 3.6 L.  He failed a voiding trial in the hospital prior to discharge.  He is taking tamsulosin 0.4 mg daily that was started in the hospital.    Foley catheter was removed this am.    Non contrast CT (01/2024) prostate volume 66 cc.    PVR ***  PMH: Past Medical History:  Diagnosis Date   Arthritis     Surgical History: No past surgical history on file.  Home Medications:  Allergies as of 03/11/2024   No Known Allergies      Medication List        Accurate as of March 09, 2024 11:24 PM. If you have any questions, ask your nurse or doctor.          cyanocobalamin 1000 MCG tablet Commonly known as: VITAMIN B12 Take 1 tablet (1,000 mcg total) by mouth daily.   tamsulosin 0.4 MG Caps capsule Commonly known as: FLOMAX Take 1 capsule (0.4 mg total) by mouth daily after supper.        Allergies: No Known Allergies  Family History: No family history on file.  Social History:  reports that he has been smoking cigarettes. He does not have any smokeless tobacco history on file. He reports current alcohol use. He reports that he does not use drugs.  ROS: Pertinent ROS in HPI  Physical Exam: There were no vitals taken for this visit.  Constitutional:  Well nourished. Alert and oriented, No acute distress. HEENT: [place] AT, moist mucus membranes.  Trachea midline, no masses. Cardiovascular: No clubbing, cyanosis, or edema. Respiratory: Normal respiratory effort, no increased work of breathing. GI: Abdomen is soft, non tender,  non distended, no abdominal masses. Liver and spleen not palpable.  No hernias appreciated.  Stool sample for occult testing is not indicated.   GU: No CVA tenderness.  No bladder fullness or masses.  Patient with circumcised/uncircumcised phallus. ***Foreskin easily retracted***  Urethral meatus is patent.  No penile discharge. No penile lesions or rashes. Scrotum without lesions, cysts, rashes and/or edema.  Testicles are located scrotally bilaterally. No masses are appreciated in the testicles. Left and right epididymis are normal. Rectal: Patient with  normal sphincter tone. Anus and perineum without scarring or rashes. No rectal masses are appreciated. Prostate is approximately *** grams, *** nodules are appreciated. Seminal vesicles are normal. Skin: No rashes, bruises or suspicious lesions. Lymph: No cervical or inguinal adenopathy. Neurologic: Grossly intact, no focal deficits, moving all 4 extremities. Psychiatric: Normal mood and affect.  Laboratory Data: See Epic and HPI   I have reviewed the labs.   Pertinent Imaging: ***  Assessment & Plan:  ***  1. Urinary retention - PVR *** - continue tamsulosin 0.4 mg daily  2. BPH with LU TS - mild, moderate severe symptoms *** and he is *** - no signs of retention, infection or malignancy *** - PSA up to date *** - DRE benign *** - UA benign *** - PVR < 300 cc *** - most bothersome symptoms are *** -  encouraged avoiding bladder irritants, fluid restriction before bedtime and timed voiding's - Initiate alpha-blocker (***), discussed side effects *** - Initiate 5 alpha reductase inhibitor (***), discussed side effects *** - Continue tamsulosin 0.4 mg daily, alfuzosin 10 mg daily, Rapaflo 8 mg daily, terazosin, doxazosin, Cialis 5 mg daily and finasteride 5 mg daily, dutasteride 0.5 mg daily***:refills given - Cannot tolerate medication or medication failure, schedule cystoscopy *** - educated on red flag symptoms: acute retention,  gross hematuria, fever, severe pain - advised to call clinic or go to the ED if these occur - return to clinic in *** symptom re-evaluation ***     No follow-ups on file.  These notes generated with voice recognition software. I apologize for typographical errors.  Francisco Woods  [facility] [address]  [address] [place], [place] [address] [phone]

## 2024-03-11 ENCOUNTER — Ambulatory Visit: Admitting: Urology

## 2024-03-11 ENCOUNTER — Ambulatory Visit: Admitting: Physician Assistant

## 2024-03-11 DIAGNOSIS — N401 Enlarged prostate with lower urinary tract symptoms: Secondary | ICD-10-CM

## 2024-03-11 DIAGNOSIS — R339 Retention of urine, unspecified: Secondary | ICD-10-CM

## 2024-03-18 ENCOUNTER — Telehealth: Payer: Self-pay

## 2024-03-24 NOTE — Progress Notes (Deleted)
 03/25/2024 1:18 PM   [name] Sep 24, 1960 [id number]  Referring provider: No referring provider defined for this encounter.  Urological history: 1.   No chief complaint on file.  HPI: Francisco Woods is a 63 y.o. man who presents today for PVR.   Previous records reviewed.  Patient presented to the ED in October for abdominal pain and was found to be in urinary retention.  A Foley was placed and there was a return of 3.6 L.  He failed a voiding trial in the hospital prior to discharge.  He is taking tamsulosin  0.4 mg daily that was started in the hospital.    Foley catheter was removed this am.    Non contrast CT (01/2024) prostate volume 66 cc.    PVR ***  PMH: Past Medical History:  Diagnosis Date   Arthritis     Surgical History: No past surgical history on file.  Home Medications:  Allergies as of 03/25/2024   No Known Allergies      Medication List        Accurate as of March 24, 2024  1:18 PM. If you have any questions, ask your nurse or doctor.          cyanocobalamin  1000 MCG tablet Commonly known as: VITAMIN B12 Take 1 tablet (1,000 mcg total) by mouth daily.   tamsulosin  0.4 MG Caps capsule Commonly known as: FLOMAX  Take 1 capsule (0.4 mg total) by mouth daily after supper.        Allergies: No Known Allergies  Family History: No family history on file.  Social History:  reports that he has been smoking cigarettes. He does not have any smokeless tobacco history on file. He reports current alcohol use. He reports that he does not use drugs.  ROS: Pertinent ROS in HPI  Physical Exam: There were no vitals taken for this visit.  Constitutional:  Well nourished. Alert and oriented, No acute distress. HEENT: [employer] AT, moist mucus membranes.  Trachea midline, no masses. Cardiovascular: No clubbing, cyanosis, or edema. Respiratory: Normal respiratory effort, no increased work of breathing. GI: Abdomen is soft, non tender,  non distended, no abdominal masses. Liver and spleen not palpable.  No hernias appreciated.  Stool sample for occult testing is not indicated.   GU: No CVA tenderness.  No bladder fullness or masses.  Patient with circumcised/uncircumcised phallus. ***Foreskin easily retracted***  Urethral meatus is patent.  No penile discharge. No penile lesions or rashes. Scrotum without lesions, cysts, rashes and/or edema.  Testicles are located scrotally bilaterally. No masses are appreciated in the testicles. Left and right epididymis are normal. Rectal: Patient with  normal sphincter tone. Anus and perineum without scarring or rashes. No rectal masses are appreciated. Prostate is approximately *** grams, *** nodules are appreciated. Seminal vesicles are normal. Skin: No rashes, bruises or suspicious lesions. Lymph: No cervical or inguinal adenopathy. Neurologic: Grossly intact, no focal deficits, moving all 4 extremities. Psychiatric: Normal mood and affect.  Laboratory Data: See Epic and HPI   I have reviewed the labs.   Pertinent Imaging: ***  Assessment & Plan:  ***  1. Urinary retention - PVR *** - continue tamsulosin  0.4 mg daily  2. BPH with LU TS - mild, moderate severe symptoms *** and he is *** - no signs of retention, infection or malignancy *** - PSA up to date *** - DRE benign *** - UA benign *** - PVR < 300 cc *** - most bothersome symptoms are *** -  encouraged avoiding bladder irritants, fluid restriction before bedtime and timed voiding's - Initiate alpha-blocker (***), discussed side effects *** - Initiate 5 alpha reductase inhibitor (***), discussed side effects *** - Continue tamsulosin  0.4 mg daily, alfuzosin 10 mg daily, Rapaflo 8 mg daily, terazosin, doxazosin, Cialis 5 mg daily and finasteride 5 mg daily, dutasteride 0.5 mg daily***:refills given - Cannot tolerate medication or medication failure, schedule cystoscopy *** - educated on red flag symptoms: acute retention,  gross hematuria, fever, severe pain - advised to call clinic or go to the ED if these occur - return to clinic in *** symptom re-evaluation ***     No follow-ups on file.  These notes generated with voice recognition software. I apologize for typographical errors.  [name]  [facility] [address]  [address] [place], [place] [address] [phone]

## 2024-03-25 ENCOUNTER — Ambulatory Visit: Admitting: Urology

## 2024-03-25 ENCOUNTER — Ambulatory Visit (INDEPENDENT_AMBULATORY_CARE_PROVIDER_SITE_OTHER): Admitting: Physician Assistant

## 2024-03-25 VITALS — BP 149/91 | HR 90

## 2024-03-25 DIAGNOSIS — R339 Retention of urine, unspecified: Secondary | ICD-10-CM

## 2024-03-25 NOTE — Progress Notes (Signed)
 Catheter Removal  Patient is present today for a catheter removal.  8ml of water was drained from the balloon. A 16FR foley cath was removed from the bladder, no complications were noted. Patient tolerated well.  Performed by: Beauford Browner, CCMA  Follow up/ Additional notes:Return for afternoon appointment with Clotilda Hockey, PA for bladder scan.

## 2024-03-27 ENCOUNTER — Emergency Department
Admission: EM | Admit: 2024-03-27 | Discharge: 2024-03-27 | Disposition: A | Discharge status: Home / Self Care | Attending: Emergency Medicine | Admitting: Emergency Medicine

## 2024-03-27 DIAGNOSIS — R339 Retention of urine, unspecified: Secondary | ICD-10-CM | POA: Diagnosis present

## 2024-03-27 DIAGNOSIS — N390 Urinary tract infection, site not specified: Secondary | ICD-10-CM | POA: Diagnosis not present

## 2024-03-27 LAB — URINALYSIS, ROUTINE W REFLEX MICROSCOPIC
Bilirubin Urine: NEGATIVE
Glucose, UA: NEGATIVE mg/dL
Ketones, ur: NEGATIVE mg/dL
Nitrite: NEGATIVE
Protein, ur: NEGATIVE mg/dL
Specific Gravity, Urine: 1.018 (ref 1.005–1.030)
Squamous Epithelial / HPF: 0 /HPF (ref 0–5)
WBC, UA: >50 WBC/hpf (ref 0–5)
pH: 6 (ref 5.0–8.0)

## 2024-03-27 MED ORDER — CEFUROXIME AXETIL 250 MG PO TABS: 250.0000 mg | ORAL_TABLET | Freq: Two times a day (BID) | ORAL | 0 refills | Status: AC

## 2024-03-27 MED ORDER — CEFUROXIME AXETIL 250 MG PO TABS
250.0000 mg | ORAL_TABLET | Freq: Once | ORAL | Status: AC
  Administered 2024-03-27: 250 mg via ORAL
  Filled 2024-03-27: qty 1

## 2024-03-27 MED ORDER — LIDOCAINE HCL URETHRAL/MUCOSAL 2 % EX GEL
1.0000 | Freq: Once | CUTANEOUS | Status: AC
  Administered 2024-03-27: 1 via URETHRAL
  Filled 2024-03-27: qty 10

## 2024-03-27 NOTE — ED Notes (Signed)
 Smith MD notified of bp.

## 2024-03-27 NOTE — Discharge Instructions (Signed)
 Ceftin /cefuroxime  antibiotics twice daily for 5 more days  Start taking Flomax  once per day every day  Follow-up with urology in the clinic  Return to the ED with any worsening symptoms

## 2024-03-27 NOTE — ED Triage Notes (Signed)
 From home, foley catheter removed two days ago, unable to urinate since them.   Alert and Oriented x4.  Pain 9 out of 10.   Pt report having previous catheter for a month

## 2024-03-27 NOTE — ED Provider Notes (Signed)
 [facility] Provider Note    Event Date/Time   First MD Initiated Contact with Patient 03/27/24 1129     (approximate)   History   Urinary Retention   HPI  Francisco Woods is a 63 y.o. male who presents to the ED for evaluation of Urinary Retention   Review urology clinic visit from 2 days ago where Foley catheter was removed.  Was admitted due to associated AKI from obstructive uropathy  Patient presents to the ED due to inability to urinate over the past 1 day.  Reports he was voiding okay after the Foley was removed but the past 24 hours has had less and less output and increasing sensation of needing to void.  No fevers, dysuria, syncope   Physical Exam   Triage Vital Signs: ED Triage Vitals  Encounter Vitals Group     BP      Girls Systolic BP Percentile      Girls Diastolic BP Percentile      Boys Systolic BP Percentile      Boys Diastolic BP Percentile      Pulse      Resp      Temp      Temp src      SpO2      Weight      Height      Head Circumference      Peak Flow      Pain Score      Pain Loc      Pain Education      Exclude from Growth Chart     Most recent vital signs: Vitals:   03/27/24 1200 03/27/24 1230  BP: (!) 192/98 (!) 182/113  Pulse: 65 71  Temp:    SpO2: 99% 100%    General: Awake, no distress.  Clearly uncomfortable CV:  Good peripheral perfusion.  Resp:  Normal effort.  Abd:  Suprapubic prominence and mild tenderness MSK:  No deformity noted.  Neuro:  No focal deficits appreciated. Other:     ED Results / Procedures / Treatments   Labs (all labs ordered are listed, but only abnormal results are displayed) Labs Reviewed  URINALYSIS, ROUTINE W REFLEX MICROSCOPIC - Abnormal; Notable for the following components:      Result Value   Color, Urine YELLOW (*)    APPearance CLOUDY (*)    Hgb urine dipstick SMALL (*)    Leukocytes,Ua SMALL (*)    Bacteria, UA RARE (*)    All other components  within normal limits  URINE CULTURE    EKG   RADIOLOGY   Official radiology report(s): No results found.  PROCEDURES and INTERVENTIONS:  Procedures  Medications  cefUROXime  (CEFTIN ) tablet 250 mg (has no administration in time range)  lidocaine  (XYLOCAINE ) 2 % jelly 1 Application (1 Application Urethral Given 03/27/24 1143)     IMPRESSION / MDM / ASSESSMENT AND PLAN / ED COURSE  I reviewed the triage vital signs and the nursing notes.  Differential diagnosis includes, but is not limited to, UTI, obstructive uropathy, sepsis or pyelonephritis, renal failure  {Patient presents with symptoms of an acute illness or injury that is potentially life-threatening.  Patient presents with urinary retention after Foley recently pulled.  Requires replacement with a 48 French coud catheter and resolution of his symptoms after this.  Subsequent urine with greater than 50 whites, bacteria, sent for culture.  Considering his UA result and recent depilation we will start a short course of  antibiotics pending this culture.  Advised him to follow-up with urology and discussed appropriate ED return precautions.  Clinical Course as of 03/27/24 1237  Thu Mar 27, 2024  1153 Reassessed, 16 French coud placed by nursing staff, draining well, improving symptoms  [DS]  1234 Reassessed.  Feeling much better and appreciative.  Discussed UA results and pending culture, prescription for antibiotics.  Reports starting back on Flomax , he has some at home and does not need a prescription. [DS]    Clinical Course User Index [DS] Claudene Rover, MD     FINAL CLINICAL IMPRESSION(S) / ED DIAGNOSES   Final diagnoses:  Urinary retention  Lower urinary tract infectious disease     Rx / DC Orders   ED Discharge Orders          Ordered    cefUROXime  (CEFTIN ) 250 MG tablet  2 times daily with meals        03/27/24 1236             Note:  This document was prepared using Dragon voice recognition  software and may include unintentional dictation errors.   Claudene Rover, MD 03/27/24 [phone]

## 2024-03-27 NOTE — ED Notes (Signed)
 Foley Catheter insertion unsuccessful, due urethral resistance, Smith MD notified, who recommended Coude Catheter insertion.

## 2024-03-31 LAB — URINE CULTURE: Culture: >=100000 — AB

## 2024-04-10 ENCOUNTER — Other Ambulatory Visit: Payer: Self-pay

## 2024-04-10 ENCOUNTER — Emergency Department: Admission: EM | Admit: 2024-04-10 | Discharge: 2024-04-10 | Disposition: A | Discharge status: Home / Self Care

## 2024-04-10 DIAGNOSIS — I10 Essential (primary) hypertension: Secondary | ICD-10-CM | POA: Diagnosis not present

## 2024-04-10 DIAGNOSIS — N3000 Acute cystitis without hematuria: Secondary | ICD-10-CM | POA: Diagnosis not present

## 2024-04-10 DIAGNOSIS — R1110 Vomiting, unspecified: Secondary | ICD-10-CM | POA: Diagnosis not present

## 2024-04-10 DIAGNOSIS — R197 Diarrhea, unspecified: Secondary | ICD-10-CM | POA: Diagnosis present

## 2024-04-10 LAB — URINALYSIS, ROUTINE W REFLEX MICROSCOPIC
Bilirubin Urine: NEGATIVE
Glucose, UA: NEGATIVE mg/dL
Ketones, ur: NEGATIVE mg/dL
Nitrite: POSITIVE — AB
Protein, ur: NEGATIVE mg/dL
Specific Gravity, Urine: 1.02 (ref 1.005–1.030)
Squamous Epithelial / HPF: 0 /HPF (ref 0–5)
WBC, UA: >50 WBC/hpf (ref 0–5)
pH: 5 (ref 5.0–8.0)

## 2024-04-10 LAB — COMPREHENSIVE METABOLIC PANEL WITH GFR
ALT: 29 U/L (ref 0–44)
AST: 17 U/L (ref 15–41)
Albumin: 3.7 g/dL (ref 3.5–5.0)
Alkaline Phosphatase: 92 U/L (ref 38–126)
Anion gap: 9 (ref 5–15)
BUN: 21 mg/dL (ref 8–23)
CO2: 27 mmol/L (ref 22–32)
Calcium: 9 mg/dL (ref 8.9–10.3)
Chloride: 105 mmol/L (ref 98–111)
Creatinine, Ser: 0.91 mg/dL (ref 0.61–1.24)
GFR, Estimated: >60 mL/min (ref >60)
Glucose, Bld: 74 mg/dL (ref 70–99)
Potassium: 5 mmol/L (ref 3.5–5.1)
Sodium: 140 mmol/L (ref 135–145)
Total Bilirubin: <0.2 mg/dL (ref 0.0–1.2)
Total Protein: 7 g/dL (ref 6.5–8.1)

## 2024-04-10 LAB — CBC
HCT: 41.9 % (ref 39.0–52.0)
Hemoglobin: 13.5 g/dL (ref 13.0–17.0)
MCH: 29.3 pg (ref 26.0–34.0)
MCHC: 32.2 g/dL (ref 30.0–36.0)
MCV: 90.9 fL (ref 80.0–100.0)
Platelets: 315 K/uL (ref 150–400)
RBC: 4.61 MIL/uL (ref 4.22–5.81)
RDW: 13.7 % (ref 11.5–15.5)
WBC: 7.7 K/uL (ref 4.0–10.5)
nRBC: 0 % (ref 0.0–0.2)

## 2024-04-10 LAB — LIPASE, BLOOD: Lipase: 21 U/L (ref 11–51)

## 2024-04-10 MED ORDER — ONDANSETRON 4 MG PO TBDP
4.0000 mg | ORAL_TABLET | Freq: Once | ORAL | Status: AC
  Administered 2024-04-10: 4 mg via ORAL
  Filled 2024-04-10: qty 1

## 2024-04-10 MED ORDER — CIPROFLOXACIN HCL 500 MG PO TABS
500.0000 mg | ORAL_TABLET | Freq: Once | ORAL | Status: AC
  Administered 2024-04-10: 500 mg via ORAL
  Filled 2024-04-10: qty 1

## 2024-04-10 MED ORDER — CIPROFLOXACIN HCL 500 MG PO TABS: 500.0000 mg | ORAL_TABLET | Freq: Two times a day (BID) | ORAL | 0 refills | Status: AC

## 2024-04-10 NOTE — Discharge Instructions (Signed)
 Please call urologist to schedule follow-up appointment.  Return to the ER for any worsening symptoms or any urgent changes in your health.

## 2024-04-10 NOTE — ED Triage Notes (Signed)
 Pt arrives via POV with c/o a urinary catheter issue since it was placed on Thanksgiving morning. Pt states that this catheter has been painful since it was placed, they have been passing blood in the catheter and has had diarrhea since the new catheter was placed. Pt is A&Ox4 during triage.

## 2024-04-10 NOTE — ED Provider Notes (Signed)
 [place] EMERGENCY DEPARTMENT AT [facility] Provider Note   CSN: [id number] Arrival date & time: 04/10/24  1420     Patient presents with: Weakness and urinary catheter issue   Francisco Woods is Francisco 63 y.o. male with history of hypertension, acute kidney injury as well as malnutrition presents for vomiting and diarrhea.  He said 5 days of 2-3 episodes of vomiting and diarrhea.  He describes some watery diarrhea with blood present.  He has had Francisco little bit of abdominal pain.  He also was seen 03/27/2024 and had Francisco catheter placed due to urinary retention.  Has seen urology who states he has some issues with his prostate, he has been off Flomax .  He is scheduled to have catheter removed on 04/15/2024 with urology.  Denies any fevers, chills, cough runny nose sore throat.   HPI     Prior to Admission medications  Medication Sig Start Date End Date Taking? Authorizing Provider  ciprofloxacin (CIPRO) 500 MG tablet Take 1 tablet (500 mg total) by mouth 2 (two) times daily for 10 days. 04/10/24 04/20/24 Yes Charlene Debby BROCKS, PA-C  cyanocobalamin  1000 MCG tablet Take 1 tablet (1,000 mcg total) by mouth daily. 02/23/24 05/23/24  Von Bellis, MD  tamsulosin  (FLOMAX ) 0.4 MG CAPS capsule Take 1 capsule (0.4 mg total) by mouth daily after supper. 02/22/24 02/21/25  Von Bellis, MD    Allergies: Patient has no known allergies.    Review of Systems  Updated Vital Signs BP 128/79 (BP Location: Left Arm)   Pulse 90   Temp 97.8 F (36.6 C) (Oral)   Resp 17   Ht 6' (1.829 m)   Wt 59 kg   SpO2 100%   BMI 17.63 kg/m   Physical Exam Constitutional:      Appearance: He is well-developed.  HENT:     Head: Normocephalic and atraumatic.  Eyes:     Conjunctiva/sclera: Conjunctivae normal.  Cardiovascular:     Rate and Rhythm: Normal rate.  Pulmonary:     Effort: Pulmonary effort is normal. No respiratory distress.  Abdominal:     General: There is no distension.      Tenderness: There is abdominal tenderness. There is no right CVA tenderness, left CVA tenderness or guarding.  Musculoskeletal:        General: Normal range of motion.     Cervical back: Normal range of motion.  Skin:    General: Skin is warm.     Findings: No rash.  Neurological:     Mental Status: He is alert and oriented to person, place, and time.  Psychiatric:        Behavior: Behavior normal.        Thought Content: Thought content normal.     (all labs ordered are listed, but only abnormal results are displayed) Labs Reviewed  URINALYSIS, ROUTINE W REFLEX MICROSCOPIC - Abnormal; Notable for the following components:      Result Value   Color, Urine YELLOW (*)    APPearance HAZY (*)    Hgb urine dipstick SMALL (*)    Nitrite POSITIVE (*)    Leukocytes,Ua LARGE (*)    Bacteria, UA MANY (*)    All other components within normal limits  LIPASE, BLOOD  COMPREHENSIVE METABOLIC PANEL WITH GFR  CBC    EKG: None  Radiology: No results found.   Procedures   Medications Ordered in the ED  ondansetron  (ZOFRAN -ODT) disintegrating tablet 4 mg (4 mg Oral Given 04/10/24 1946)  ciprofloxacin (CIPRO) tablet 500 mg (500 mg Oral Given 04/10/24 2159)                                    Medical Decision Making Amount and/or Complexity of Data Reviewed Labs: ordered.  Risk Prescription drug management.  64 year old male with urinary retention, had Francisco Foley catheter placement was having some discomfort.  Urinalysis showed signs of UTI with leukocytes nitrites bacteria and WBCs.  Patient started on ciprofloxacin.  He is afebrile and nontachycardic.  He is also had some reports of gastritis issues with vomiting and diarrhea.  Will start on Cipro 500 mg twice daily.  He is given strict return precautions understands signs symptoms return to the ER for.  Final diagnoses:  Acute cystitis without hematuria    ED Discharge Orders          Ordered    ciprofloxacin (CIPRO) 500 MG  tablet  2 times daily        04/10/24 2202               Jamie Hafford C, PA-C 04/10/24 2204    Clarine Ozell LABOR, MD 04/11/24 1105

## 2024-04-15 ENCOUNTER — Ambulatory Visit: Admitting: Urology

## 2024-04-15 ENCOUNTER — Ambulatory Visit: Admitting: Physician Assistant

## 2024-04-15 ENCOUNTER — Encounter: Payer: Self-pay | Admitting: Physician Assistant

## 2024-04-15 VITALS — BP 143/84 | HR 96 | Ht 72.0 in | Wt 148.7 lb

## 2024-04-15 DIAGNOSIS — R339 Retention of urine, unspecified: Secondary | ICD-10-CM

## 2024-04-15 MED ORDER — SILODOSIN 8 MG PO CAPS: 8.0000 mg | ORAL_CAPSULE | Freq: Every day | ORAL | 11 refills | Status: AC
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
## 2024-04-16 ENCOUNTER — Telehealth: Payer: Self-pay

## 2024-04-16 NOTE — Telephone Encounter (Signed)
 One of the following: (i) You have failed one preferred drug(s) as confirmed by claims history or submission of medical records: alfuzosin extended-release tablet, doxazosin tablet, dutasteride capsule, finasteride 5mg  tablet and terazosin capsule. (ii) You cannot use one preferred drug(s) (please specify contraindication or intolerance).

## 2024-04-16 NOTE — Telephone Encounter (Signed)
 He is already having orthostasis on tamsulosin ; I do not recommend switching him to a less prostate specific alpha-blocker as this will predictably make his dizziness worse and increase his risk for falls.  With this denial, I recommend stopping alpha blockers and keeping plans to discuss outlet procedures with Dr. Francisca.

## 2024-04-16 NOTE — Telephone Encounter (Signed)
 Francisco Woods ([name]) PA Case ID #: [id number] Rx #: [id number] Need Help? Call us  at [phone]  Outcome Denied today by [facility] 2017 NCPDP  Request Reference Number: [id number]. SILODOSIN  CAP 8MG  is denied for not meeting the prior authorization requirement(s). For further questions, call Community & State at [phone] for more information. Drug Silodosin  8MG  capsules  Form  [employer] Electronic Prior Authorization Form (2017 NCPDP) Original Claim Info  52 01PA Req, Dr submit ePA at [email or web address] MD Call [phone], Possible 3 DS +03S

## 2024-04-16 NOTE — Telephone Encounter (Signed)
 Francisco Woods (Key: [id number]) Prior Auth Sent [employer] is reviewing your PA request. Typically an electronic response will be received within 24-72 hours. Awaiting approval.

## 2024-04-18 NOTE — Telephone Encounter (Signed)
 Phone call attempted. Cell phone requests for you to press 1 so you can be connected to an agent, no answer. Home telephone number out of service.

## 2024-04-18 NOTE — Telephone Encounter (Signed)
 Phone call attempted to emergency contact (DPR) no answer. Mailbox box is full and cannot receive any more messages.

## 2024-04-21 NOTE — Telephone Encounter (Signed)
 Unable to send letter no home address on file.

## 2024-04-21 NOTE — Telephone Encounter (Signed)
 Phone call attempted. Unsuccessful. Letter mailed to patient

## 2024-05-15 ENCOUNTER — Other Ambulatory Visit: Payer: Self-pay

## 2024-05-15 ENCOUNTER — Ambulatory Visit (INDEPENDENT_AMBULATORY_CARE_PROVIDER_SITE_OTHER): Admitting: Urology

## 2024-05-15 VITALS — BP 164/99 | HR 98 | Ht 72.0 in | Wt 147.0 lb

## 2024-05-15 DIAGNOSIS — N138 Other obstructive and reflux uropathy: Secondary | ICD-10-CM | POA: Diagnosis not present

## 2024-05-15 DIAGNOSIS — N401 Enlarged prostate with lower urinary tract symptoms: Secondary | ICD-10-CM
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
## 2024-05-15 NOTE — Patient Instructions (Signed)

## 2024-05-15 NOTE — Progress Notes (Signed)
 Surgical Physician Order Form [facility]  Dr. Redell Burnet, MD  * Scheduling expectation : Next Available  *Length of Case: 1.5 hours  *Clearance needed: no  *Anticoagulation Instructions: Hold all anticoagulants  *Aspirin Instructions: Hold Aspirin  *Post-op visit Date/Instructions:  1-3 day cath removal  *Diagnosis: BPH w/urinary obstruction  *Procedure:  HOLEP (47350)   Additional orders: N/A  -Admit type: OUTpatient  -Anesthesia: General  -VTE Prophylaxis Standing Order SCD's       Other:   -Standing Lab Orders Per Anesthesia    Lab other: UA&Urine Culture  -Standing Test orders EKG/Chest x-ray per Anesthesia       Test other:   - Medications: Cipro  400 mg IV  -Other orders: Needs Foley changed 2 weeks prior to surgery date with preop urine culture

## 2024-05-28 ENCOUNTER — Telehealth: Payer: Self-pay

## 2024-05-28 NOTE — Progress Notes (Signed)
" ° °  [facility] Surgical Posting Form  Surgery Date: Date: 06/30/2024  Surgeon: Dr. Redell Burnet, MD  Inpt ( No  )   Outpt (Yes)   Obs ( No  )   Diagnosis: N40.1, N13.8 Benign Prostatic Hyperplasia with Urinary Obstruction  -CPT: [phone]  Surgery: Holmium Laser Enucleation of the Prostate  Stop Anticoagulations: Yes and also hold ASA  Cardiac/Medical/Pulmonary Clearance needed: no \ *Orders entered into EPIC  Date: 05/28/24   *Case booked in EPIC  Date: 05/28/24  *Notified pt of Surgery: Date: 05/28/24  PRE-OP UA & CX: yes, will obtain in clinic on 06/16/2024  *Placed into Prior Authorization Work [place] Date: 05/28/24  Assistant/laser/rep:No                "

## 2024-05-28 NOTE — Telephone Encounter (Signed)
 Per Dr. Francisca, Patient is to be scheduled for Holmium Laser Enucleation of the Prostate   Francisco Woods was contacted and possible surgical dates were discussed, Monday March 2nd, 2026 was agreed upon for surgery.   Patient was instructed that Dr. Francisca will require them to provide a pre-op UA & CX prior to surgery. This was ordered and scheduled drop off appointment was made for 06/16/2024.    Patient was directed to call [phone] between 1-3pm the day before surgery to find out surgical arrival time.  Instructions were given not to eat or drink from midnight on the night before surgery and have a driver for the day of surgery. On the surgery day patient was instructed to enter through the [facility] entrance of [facility] report the Same Day Surgery desk.   Pre-Admit Testing will be in contact via phone to set up an interview with the anesthesia team to review your history and medications prior to surgery.   Reminder of this information was sent via Mail to the patient.

## 2024-05-30 ENCOUNTER — Ambulatory Visit: Admitting: Urology

## 2024-06-16 ENCOUNTER — Ambulatory Visit: Admitting: Physician Assistant

## 2024-06-30 ENCOUNTER — Ambulatory Visit: Admit: 2024-06-30 | Admitting: Urology

## 2024-06-30 SURGERY — ENUCLEATION, PROSTATE, USING LASER, WITH MORCELLATION
Anesthesia: General

## 2024-07-02 ENCOUNTER — Encounter: Admitting: Physician Assistant
